# Patient Record
Sex: Female | Born: 1935 | Race: Black or African American | Hispanic: No | Smoking: Never smoker
Health system: Southern US, Community
[De-identification: ages and names within clinical notes are randomized; demographics above are authoritative.]

## PROBLEM LIST (undated history)

## (undated) DIAGNOSIS — I10 Essential (primary) hypertension: Secondary | ICD-10-CM

## (undated) DIAGNOSIS — M1990 Unspecified osteoarthritis, unspecified site: Secondary | ICD-10-CM

## (undated) DIAGNOSIS — H409 Unspecified glaucoma: Secondary | ICD-10-CM

## (undated) DIAGNOSIS — K219 Gastro-esophageal reflux disease without esophagitis: Secondary | ICD-10-CM

## (undated) DIAGNOSIS — E119 Type 2 diabetes mellitus without complications: Secondary | ICD-10-CM

## (undated) DIAGNOSIS — F0390 Unspecified dementia without behavioral disturbance: Secondary | ICD-10-CM

---

## 2004-11-07 ENCOUNTER — Ambulatory Visit (HOSPITAL_COMMUNITY): Admission: RE | Admit: 2004-11-07 | Discharge: 2004-11-07 | Payer: Self-pay | Admitting: Specialist

## 2011-03-22 ENCOUNTER — Observation Stay (HOSPITAL_COMMUNITY)
Admission: EM | Admit: 2011-03-22 | Discharge: 2011-03-26 | Disposition: A | Discharge status: Home / Self Care | Payer: Medicare Other | Attending: Internal Medicine | Admitting: Internal Medicine

## 2011-03-22 ENCOUNTER — Emergency Department (HOSPITAL_COMMUNITY): Payer: Medicare Other

## 2011-03-22 DIAGNOSIS — H409 Unspecified glaucoma: Secondary | ICD-10-CM | POA: Insufficient documentation

## 2011-03-22 DIAGNOSIS — J189 Pneumonia, unspecified organism: Secondary | ICD-10-CM | POA: Insufficient documentation

## 2011-03-22 DIAGNOSIS — R55 Syncope and collapse: Secondary | ICD-10-CM | POA: Insufficient documentation

## 2011-03-22 DIAGNOSIS — F05 Delirium due to known physiological condition: Secondary | ICD-10-CM | POA: Insufficient documentation

## 2011-03-22 DIAGNOSIS — Z79899 Other long term (current) drug therapy: Secondary | ICD-10-CM | POA: Insufficient documentation

## 2011-03-22 DIAGNOSIS — E1169 Type 2 diabetes mellitus with other specified complication: Principal | ICD-10-CM | POA: Insufficient documentation

## 2011-03-22 DIAGNOSIS — Z90710 Acquired absence of both cervix and uterus: Secondary | ICD-10-CM | POA: Insufficient documentation

## 2011-03-22 DIAGNOSIS — R9431 Abnormal electrocardiogram [ECG] [EKG]: Secondary | ICD-10-CM | POA: Insufficient documentation

## 2011-03-22 DIAGNOSIS — I10 Essential (primary) hypertension: Secondary | ICD-10-CM | POA: Insufficient documentation

## 2011-03-22 DIAGNOSIS — I517 Cardiomegaly: Secondary | ICD-10-CM | POA: Insufficient documentation

## 2011-03-22 LAB — CK TOTAL AND CKMB (NOT AT ARMC)
CK, MB: 1.9 ng/mL (ref 0.3–4.0)
Relative Index: INVALID (ref 0.0–2.5)
Total CK: 96 U/L (ref 7–177)

## 2011-03-22 LAB — URINALYSIS, ROUTINE W REFLEX MICROSCOPIC
Bilirubin Urine: NEGATIVE
Glucose, UA: 250 mg/dL — AB
Ketones, ur: NEGATIVE mg/dL
Nitrite: NEGATIVE
Protein, ur: NEGATIVE mg/dL
Specific Gravity, Urine: 1.012 (ref 1.005–1.030)
Urobilinogen, UA: 1 mg/dL (ref 0.0–1.0)
pH: 5.5 (ref 5.0–8.0)

## 2011-03-22 LAB — TROPONIN I: Troponin I: <0.3 ng/mL (ref <0.30)

## 2011-03-22 LAB — URINE MICROSCOPIC-ADD ON

## 2011-03-22 LAB — CBC
HCT: 37.3 % (ref 36.0–46.0)
Hemoglobin: 12.5 g/dL (ref 12.0–15.0)
MCH: 30.1 pg (ref 26.0–34.0)
MCHC: 33.5 g/dL (ref 30.0–36.0)
MCV: 89.9 fL (ref 78.0–100.0)
Platelets: 195 10*3/uL (ref 150–400)
RBC: 4.15 MIL/uL (ref 3.87–5.11)
RDW: 13.7 % (ref 11.5–15.5)
WBC: 8 10*3/uL (ref 4.0–10.5)

## 2011-03-22 LAB — COMPREHENSIVE METABOLIC PANEL
ALT: 14 U/L (ref 0–35)
AST: 17 U/L (ref 0–37)
Albumin: 3.5 g/dL (ref 3.5–5.2)
Alkaline Phosphatase: 64 U/L (ref 39–117)
BUN: 18 mg/dL (ref 6–23)
CO2: 28 mEq/L (ref 19–32)
Calcium: 8.6 mg/dL (ref 8.4–10.5)
Chloride: 96 mEq/L (ref 96–112)
Creatinine, Ser: 0.72 mg/dL (ref 0.4–1.2)
GFR calc Af Amer: >60 mL/min (ref >60)
GFR calc non Af Amer: >60 mL/min (ref >60)
Glucose, Bld: 110 mg/dL — ABNORMAL HIGH (ref 70–99)
Potassium: 4.2 mEq/L (ref 3.5–5.1)
Sodium: 132 mEq/L — ABNORMAL LOW (ref 135–145)
Total Bilirubin: 0.7 mg/dL (ref 0.3–1.2)
Total Protein: 7.2 g/dL (ref 6.0–8.3)

## 2011-03-22 LAB — DIFFERENTIAL
Basophils Absolute: 0 10*3/uL (ref 0.0–0.1)
Basophils Relative: 0 % (ref 0–1)
Eosinophils Absolute: 0.1 10*3/uL (ref 0.0–0.7)
Eosinophils Relative: 1 % (ref 0–5)
Lymphocytes Relative: 16 % (ref 12–46)
Lymphs Abs: 1.2 10*3/uL (ref 0.7–4.0)
Monocytes Absolute: 0.8 10*3/uL (ref 0.1–1.0)
Monocytes Relative: 10 % (ref 3–12)
Neutro Abs: 5.8 10*3/uL (ref 1.7–7.7)
Neutrophils Relative %: 73 % (ref 43–77)

## 2011-03-22 LAB — GLUCOSE, CAPILLARY: Glucose-Capillary: 228 mg/dL — ABNORMAL HIGH (ref 70–99)

## 2011-03-22 LAB — LIPASE, BLOOD: Lipase: 25 U/L (ref 11–59)

## 2011-03-23 LAB — GLUCOSE, CAPILLARY
Glucose-Capillary: 208 mg/dL — ABNORMAL HIGH (ref 70–99)
Glucose-Capillary: 177 mg/dL — ABNORMAL HIGH (ref 70–99)
Glucose-Capillary: 70 mg/dL (ref 70–99)
Glucose-Capillary: 141 mg/dL — ABNORMAL HIGH (ref 70–99)
Glucose-Capillary: 97 mg/dL (ref 70–99)
Glucose-Capillary: 81 mg/dL (ref 70–99)
Glucose-Capillary: 112 mg/dL — ABNORMAL HIGH (ref 70–99)
Glucose-Capillary: 167 mg/dL — ABNORMAL HIGH (ref 70–99)

## 2011-03-23 LAB — CBC
HCT: 34 % — ABNORMAL LOW (ref 36.0–46.0)
Hemoglobin: 11.4 g/dL — ABNORMAL LOW (ref 12.0–15.0)
MCH: 30 pg (ref 26.0–34.0)
MCHC: 33.5 g/dL (ref 30.0–36.0)
MCV: 89.5 fL (ref 78.0–100.0)
Platelets: 194 10*3/uL (ref 150–400)
RBC: 3.8 MIL/uL — ABNORMAL LOW (ref 3.87–5.11)
RDW: 13.6 % (ref 11.5–15.5)
WBC: 7.3 10*3/uL (ref 4.0–10.5)

## 2011-03-23 LAB — BASIC METABOLIC PANEL
BUN: 9 mg/dL (ref 6–23)
CO2: 27 mEq/L (ref 19–32)
Calcium: 8 mg/dL — ABNORMAL LOW (ref 8.4–10.5)
Chloride: 105 mEq/L (ref 96–112)
Creatinine, Ser: 0.53 mg/dL (ref 0.4–1.2)
GFR calc Af Amer: >60 mL/min (ref >60)
GFR calc non Af Amer: >60 mL/min (ref >60)
Glucose, Bld: 78 mg/dL (ref 70–99)
Potassium: 3.8 mEq/L (ref 3.5–5.1)
Sodium: 137 mEq/L (ref 135–145)

## 2011-03-23 LAB — DIFFERENTIAL
Basophils Absolute: 0 10*3/uL (ref 0.0–0.1)
Basophils Relative: 0 % (ref 0–1)
Eosinophils Absolute: 0.1 10*3/uL (ref 0.0–0.7)
Eosinophils Relative: 1 % (ref 0–5)
Lymphocytes Relative: 21 % (ref 12–46)
Lymphs Abs: 1.5 10*3/uL (ref 0.7–4.0)
Monocytes Absolute: 0.8 10*3/uL (ref 0.1–1.0)
Monocytes Relative: 12 % (ref 3–12)
Neutro Abs: 4.8 10*3/uL (ref 1.7–7.7)
Neutrophils Relative %: 66 % (ref 43–77)

## 2011-03-23 LAB — HEMOGLOBIN A1C
Hgb A1c MFr Bld: 6.1 % — ABNORMAL HIGH (ref <5.7)
Mean Plasma Glucose: 128 mg/dL — ABNORMAL HIGH (ref <117)

## 2011-03-23 LAB — TSH: TSH: 0.625 u[IU]/mL (ref 0.350–4.500)

## 2011-03-24 LAB — GLUCOSE, CAPILLARY
Glucose-Capillary: 127 mg/dL — ABNORMAL HIGH (ref 70–99)
Glucose-Capillary: 159 mg/dL — ABNORMAL HIGH (ref 70–99)
Glucose-Capillary: 165 mg/dL — ABNORMAL HIGH (ref 70–99)
Glucose-Capillary: 185 mg/dL — ABNORMAL HIGH (ref 70–99)

## 2011-03-25 LAB — GLUCOSE, CAPILLARY
Glucose-Capillary: 98 mg/dL (ref 70–99)
Glucose-Capillary: 75 mg/dL (ref 70–99)
Glucose-Capillary: 140 mg/dL — ABNORMAL HIGH (ref 70–99)
Glucose-Capillary: 183 mg/dL — ABNORMAL HIGH (ref 70–99)

## 2011-03-25 LAB — BASIC METABOLIC PANEL
BUN: 12 mg/dL (ref 6–23)
CO2: 23 mEq/L (ref 19–32)
Calcium: 8.8 mg/dL (ref 8.4–10.5)
Chloride: 104 mEq/L (ref 96–112)
Creatinine, Ser: 0.54 mg/dL (ref 0.4–1.2)
GFR calc Af Amer: >60 mL/min (ref >60)
GFR calc non Af Amer: >60 mL/min (ref >60)
Glucose, Bld: 105 mg/dL — ABNORMAL HIGH (ref 70–99)
Potassium: 4 mEq/L (ref 3.5–5.1)
Sodium: 136 mEq/L (ref 135–145)

## 2011-03-26 LAB — GLUCOSE, CAPILLARY: Glucose-Capillary: 123 mg/dL — ABNORMAL HIGH (ref 70–99)

## 2011-03-29 LAB — CULTURE, BLOOD (ROUTINE X 2)
Culture  Setup Time: 201206020144
Culture  Setup Time: 201206020144
Culture: NO GROWTH
Culture: NO GROWTH

## 2011-03-30 NOTE — H&P (Signed)
NAMECHELSE, MATAS NO.:  [id number]  MEDICAL RECORD NO.:  [id number]           PATIENT TYPE:  E  LOCATION:  WLED                         FACILITY:  [facility]  PHYSICIAN:  Della Goo, M.D. DATE OF BIRTH:  [birth year]  DATE OF ADMISSION:  03/22/2011 DATE OF DISCHARGE:                             HISTORY & PHYSICAL   DATE OF ADMISSION:  March 22, 2011.  PRIMARY CARE PHYSICIAN:  Georgianne Fick, M.D.  CHIEF COMPLAINTS:  Confusion, low blood sugar.  HISTORY OF PRESENT ILLNESS:  This is an 77 year old female who was brought to the emergency department after suffering an episode of confusion described as disorientation by her daughter.  Her daughter called EMS and EMS checked her blood sugar and she was found to have a blood sugar level of 44.  The patient has type 2 diabetes mellitus and takes glyburide therapy and she has had a decrease appetite over the past 2 days.  The patient reports almost passing out during this episode.  She denies having any nausea, vomiting or any chest pain or shortness of breath.  She does report for the past few days having cough which has been productive of greenish sputum.  The patient was brought to the emergency department, evaluated and a chest x-ray was performed which did reveal a left lower lobe pneumonia. The patient was started on Avelox therapy orally and administered dextrose for the low blood sugars and referred for medical admission.  PAST MEDICAL HISTORY:  Significant for type 2 diabetes mellitus, hypertension, glaucoma with visual impairment.  PAST SURGICAL HISTORY:  History of a hysterectomy and a bladder tacking surgery and right eye surgery for glaucoma.  MEDICATIONS:  Medications at this time include glyburide, sertraline, metoprolol, Mucinex over-the-counter and the glaucoma ophthalmic drops.  SOCIAL HISTORY:  The patient lives alone but temporarily has been staying with her daughter.  She is a  nonsmoker, nondrinker.  No history of illicit drug usage.  FAMILY HISTORY:  Noncontributory to this admission.  REVIEW OF SYSTEMS:  Pertinents as mentioned above in the HPI.  All other review of systems are negative.  PHYSICAL EXAMINATION FINDINGS:  GENERAL:  This is an 77 year old thin, elderly African-American female who is in no acute distress currently. VITAL SIGNS:  Temperature 99.7, blood pressure 135/76, heart rate 53 to 72, respirations 16 to 18, O2 sats 98% to 100%. HEENT EXAMINATION:  Normocephalic, atraumatic.  Pupils equally round and reactive to light.  Unable to visualize funduscopically.  There is no scleral icterus.  Nares patent bilaterally.  Oropharynx clear.  Oral mucosa is dry. NECK:  Supple.  Full range of motion.  No thyromegaly, adenopathy, jugular venous distention. CARDIOVASCULAR:  Regular rate and rhythm.  No murmurs, gallops or rubs appreciated. LUNGS:  Clear to auscultation.  No rales, rhonchi or wheezes appreciated.  Chest wall is nontender and breathing is unlabored and there is symmetric excursion of the chest wall. ABDOMEN:  Positive bowel sounds, soft, nontender, nondistended.  No hepatosplenomegaly. EXTREMITIES:  Without cyanosis, clubbing or edema. NEUROLOGIC EXAMINATION:  There are no focal deficits other than the visual impairment.  LABORATORY STUDIES:  White blood cell count 8.0, hemoglobin 12.5, hematocrit 37.3, platelets 195, neutrophils 73% lymphocytes 16%.  Sodium 132, potassium 4.2, chloride 96, CO2 of 28, BUN 18, creatinine 0.72 and glucose 110.  Cardiac enzymes with a total CK of 96, CK-MB 1.6, troponin less than 0.30.  Lipase level 25.  Urinalysis with glucose of 250, moderate urine hemoglobin and trace leukocyte esterase.  Chest x-ray reveals a streaky opacity in the left lower lung base.  ASSESSMENT:  An 77 year old female being admitted with: 1. Hypoglycemic episode secondary to poor intake and being on     hypoglycemic  medications. 2. Left lower lobe pneumonia which is community-acquired. 3. Type 2 diabetes mellitus. 4. Glaucoma. 5. Hypertension.  PLAN:  The patient will be admitted for 23-hour observation and serial glucose levels will be checked q.1 h x6, then q.4 h.  The hypoglycemic protocol will be followed and blood cultures x2 have been ordered along with nebulizer treatments, supplemental oxygen as needed.  The patient will continue on the Mucinex therapy and the antibiotic therapy.  For now, we will continue with Avelox p.o.  The patient's regular medications have been reconciled.  Her glyburide therapy will be held for now and the patient will be placed on DVT prophylaxis and she is a full code at this time.  Further workup will ensue pending results of the patient's clinical course and her studies.     Della Goo, M.D.     HJ/MEDQ  D:  03/22/2011  T:  03/22/2011  Job:  [id number]  cc:   Georgianne Fick, M.D. Fax: [phone]  Electronically Signed by Della Goo M.D. on 03/30/2011 06:32:36 AM

## 2011-04-25 NOTE — Discharge Summary (Signed)
[name], [name] NO.:  [id number]  MEDICAL RECORD NO.:  [id number]  LOCATION:  1332                         FACILITY:  [facility]  PHYSICIAN:  Clydia Llano, MD       DATE OF BIRTH:  [birth year]  DATE OF ADMISSION:  03/22/2011 DATE OF DISCHARGE:                              DISCHARGE SUMMARY   PRIMARY CARE PHYSICIAN:  Dr. Georgianne Fick  REASON FOR ADMISSION:  Confusion.  DISCHARGE DIAGNOSES: 1. Pneumonia. 2. Hypoglycemia and type 2 diabetes. 3. Diabetes mellitus type 2. 4. Glaucoma. 5. Hypertension.  DISCHARGE MEDICATIONS: 1. Glipizide 5 mg p.o. daily with meals. 2. Avelox 400 mg p.o. daily for 4 more days. 3. Combigan ophthalmic solution 1 drop in both eyes b.i.d. 4. Metoprolol tartrate 50 mg p.o. b.i.d. 5. Robitussin DM take 5 mL twice day as needed for cough. 6. Sertraline 50 mg p.o. daily. 7. Travatan ophthalmic 0.004% 1 drop daily at bedtime.  RADIOLOGY:  Chest x-ray on March 22, 2011, showed streak opacity in the left lung base, may represent his atelectasis or pneumonia.  There is cardiomegaly.  BRIEF HISTORY EXAMINATION:  [name] is an 77 year old African- American female with past medical history of diabetes and hypertension. The patient brought into the hospital because of confusion described as disorientation by her daughter.  EMS called and checked on her blood sugars.  She was found to have capillary blood sugar of 44.  The patient has type 2 diabetes and taking glyburide 10 mg and had decreased appetite over the past few days.  The patient reported almost passing out during these episodes.  Denies having nausea, vomiting or chest pain or shortness of breath.  Upon initial evaluation in the emergency department, the patient evaluated with the chest x-ray, she did develop left lower lobe pneumonia.  The patient was started on Avelox orally and administered dextrose for low blood sugar and referred for medical admission.  BRIEF  HOSPITAL STAY: 1. Left lower lobe pneumonia.  As mentioned above, chest x-ray showed     left lower lobe pneumonia.  The patient was started on oral Avelox.     Mucolytics and bronchodilators as well as oxygen also were     provided.  The patient was doing well and she did not need oxygen     for long time.  At time of discharge, the patient was felt well to     be discharged. 2. Hypoglycemia and type 2 diabetes.  As mentioned above, the patient     came in with hypoglycemia with capillary blood glucose of 44.  The     patient is taking glyburide that was stopped.  The patient was     placed on IV dextrose for less than 12 hours and then the patient's     blood sugar glucose went up and that was discontinued.  The     hypoglycemia is likely secondary to sulfonylurea along with the     poor oral intake.  That was switched to glipizide 5 mg daily at     time of discharge. 3. Diabetes mellitus type 2.  Seems pretty controlled on sulfonylureas  with hemoglobin A1c 6.1.  The dose of glyburide was switched to     glipizide 5 mg and the patient needs to followup for further     adjustment with her primary care physician. 4. Hypertension, controlled, during the hospital stay without any     changes in her medications.  The patient is on beta-blocker.  I     will suggest consideration to start ACE inhibitor or ARB to control     the blood pressure as the patient has diabetes but this is per her     primary care physician to follow up on.  DISCHARGE INSTRUCTIONS: 1. Diet:  Carbohydrate-modified diet. 2. Disposition:  Home 3. Activity:  As tolerated.     Clydia Llano, MD     ME/MEDQ  D:  03/26/2011  T:  03/26/2011  Job:  [id number]  cc:   Georgianne Fick, M.D. Fax: [phone]  Electronically Signed by Clydia Llano  on 04/25/2011 01:44:02 PM

## 2014-10-05 ENCOUNTER — Other Ambulatory Visit: Payer: Self-pay | Admitting: Internal Medicine

## 2014-10-05 DIAGNOSIS — R1310 Dysphagia, unspecified: Secondary | ICD-10-CM

## 2014-11-08 DIAGNOSIS — H4011X3 Primary open-angle glaucoma, severe stage: Secondary | ICD-10-CM | POA: Diagnosis not present

## 2014-12-19 ENCOUNTER — Emergency Department (HOSPITAL_COMMUNITY): Payer: Medicare Other

## 2014-12-19 ENCOUNTER — Emergency Department (HOSPITAL_COMMUNITY)
Admission: EM | Admit: 2014-12-19 | Discharge: 2014-12-19 | Disposition: A | Discharge status: Home / Self Care | Payer: Medicare Other | Attending: Emergency Medicine | Admitting: Emergency Medicine

## 2014-12-19 ENCOUNTER — Encounter (HOSPITAL_COMMUNITY): Payer: Self-pay | Admitting: Emergency Medicine

## 2014-12-19 DIAGNOSIS — Z79899 Other long term (current) drug therapy: Secondary | ICD-10-CM | POA: Insufficient documentation

## 2014-12-19 DIAGNOSIS — R0789 Other chest pain: Secondary | ICD-10-CM | POA: Diagnosis not present

## 2014-12-19 DIAGNOSIS — H409 Unspecified glaucoma: Secondary | ICD-10-CM | POA: Insufficient documentation

## 2014-12-19 DIAGNOSIS — F0390 Unspecified dementia without behavioral disturbance: Secondary | ICD-10-CM | POA: Diagnosis not present

## 2014-12-19 DIAGNOSIS — I10 Essential (primary) hypertension: Secondary | ICD-10-CM | POA: Diagnosis not present

## 2014-12-19 DIAGNOSIS — R079 Chest pain, unspecified: Secondary | ICD-10-CM | POA: Insufficient documentation

## 2014-12-19 DIAGNOSIS — M546 Pain in thoracic spine: Secondary | ICD-10-CM

## 2014-12-19 DIAGNOSIS — Z791 Long term (current) use of non-steroidal anti-inflammatories (NSAID): Secondary | ICD-10-CM | POA: Insufficient documentation

## 2014-12-19 DIAGNOSIS — E119 Type 2 diabetes mellitus without complications: Secondary | ICD-10-CM | POA: Diagnosis not present

## 2014-12-19 HISTORY — DX: Unspecified glaucoma: H40.9

## 2014-12-19 HISTORY — DX: Type 2 diabetes mellitus without complications: E11.9

## 2014-12-19 HISTORY — DX: Unspecified dementia, unspecified severity, without behavioral disturbance, psychotic disturbance, mood disturbance, and anxiety: F03.90

## 2014-12-19 HISTORY — DX: Essential (primary) hypertension: I10

## 2014-12-19 LAB — BASIC METABOLIC PANEL
Anion gap: 7 (ref 5–15)
BUN: 19 mg/dL (ref 6–23)
CO2: 29 mmol/L (ref 19–32)
Calcium: 9.2 mg/dL (ref 8.4–10.5)
Chloride: 100 mmol/L (ref 96–112)
Creatinine, Ser: 0.67 mg/dL (ref 0.50–1.10)
GFR calc Af Amer: 88 mL/min — ABNORMAL LOW (ref >90)
GFR calc non Af Amer: 76 mL/min — ABNORMAL LOW (ref >90)
Glucose, Bld: 191 mg/dL — ABNORMAL HIGH (ref 70–99)
Potassium: 3.7 mmol/L (ref 3.5–5.1)
Sodium: 136 mmol/L (ref 135–145)

## 2014-12-19 LAB — CBC
HCT: 38.8 % (ref 36.0–46.0)
Hemoglobin: 12.6 g/dL (ref 12.0–15.0)
MCH: 30.4 pg (ref 26.0–34.0)
MCHC: 32.5 g/dL (ref 30.0–36.0)
MCV: 93.5 fL (ref 78.0–100.0)
Platelets: 210 10*3/uL (ref 150–400)
RBC: 4.15 MIL/uL (ref 3.87–5.11)
RDW: 14.1 % (ref 11.5–15.5)
WBC: 7.4 10*3/uL (ref 4.0–10.5)

## 2014-12-19 LAB — I-STAT TROPONIN, ED: Troponin i, poc: 0.01 ng/mL (ref 0.00–0.08)

## 2014-12-19 MED ORDER — KETOROLAC TROMETHAMINE 60 MG/2ML IM SOLN
60.0000 mg | Freq: Once | INTRAMUSCULAR | Status: AC
  Administered 2014-12-19: 60 mg via INTRAMUSCULAR
  Filled 2014-12-19: qty 2

## 2014-12-19 MED ORDER — HYDROCODONE-ACETAMINOPHEN 5-325 MG PO TABS: 1.0000 | ORAL_TABLET | ORAL | Status: DC | PRN

## 2014-12-19 MED ORDER — HYDROCODONE-ACETAMINOPHEN 5-325 MG PO TABS
1.0000 | ORAL_TABLET | Freq: Once | ORAL | Status: AC
  Administered 2014-12-19: 1 via ORAL
  Filled 2014-12-19: qty 1

## 2014-12-19 NOTE — ED Notes (Signed)
Pt wants to eat something before administration of pain medication. Food provided. Will give meds shortly

## 2014-12-19 NOTE — ED Provider Notes (Signed)
CSN: [id number]     Arrival date & time 12/19/14  1234 History   First MD Initiated Contact with Patient 12/19/14 1456       Level V caveat: Dementia  The history is provided by the patient and a relative.   Patient presents to the emergency department complaining of left upper back pain over the past several days.  Patient does have a history of dementia and therefore she is somewhat limited in providing clear dates.  Family does admit that she has been complaining of this discomfort and pain over the past several days.  No reports of fever.  No reports of rash.  Patient does not seem short of breath to the family.  Appetite has been normal.  No vomiting or diarrhea noted by family or the patient.  Patient tried meloxicam with some improvement in his symptoms today.   Past Medical History  Diagnosis Date  . Diabetes mellitus without complication   . Hypertension   . Glaucoma   . Dementia    No past surgical history on file. No family history on file. History  Substance Use Topics  . Smoking status: Not on file  . Smokeless tobacco: Not on file  . Alcohol Use: Not on file   OB History    No data available     Review of Systems  Unable to perform ROS: Dementia      Allergies  Review of patient's allergies indicates no known allergies.  Home Medications   Prior to Admission medications   Medication Sig Start Date End Date Taking? Authorizing Provider  amLODipine (NORVASC) 5 MG tablet Take 5 mg by mouth daily.   Yes Historical Provider, MD  Brinzolamide-Brimonidine 1-0.2 % SUSP Apply 1 drop to eye 3 (three) times daily.   Yes Historical Provider, MD  divalproex (DEPAKOTE) 250 MG DR tablet Take 250 mg by mouth 2 (two) times daily.   Yes Historical Provider, MD  donepezil (ARICEPT) 10 MG tablet Take 10 mg by mouth at bedtime.   Yes Historical Provider, MD  glipiZIDE (GLUCOTROL) 5 MG tablet Take 5 mg by mouth daily before breakfast.   Yes Historical Provider, MD  guaiFENesin  (ROBITUSSIN) 100 MG/5ML liquid Take 200 mg by mouth daily as needed for cough.   Yes Historical Provider, MD  meloxicam (MOBIC) 15 MG tablet Take 15 mg by mouth daily.   Yes Historical Provider, MD  memantine (NAMENDA XR) 28 MG CP24 24 hr capsule Take 28 mg by mouth daily.   Yes Historical Provider, MD  sertraline (ZOLOFT) 100 MG tablet Take 100 mg by mouth at bedtime.   Yes Historical Provider, MD  travoprost, benzalkonium, (TRAVATAN) 0.004 % ophthalmic solution Place 1 drop into both eyes at bedtime.   Yes Historical Provider, MD   BP 203/69 mmHg  Pulse 58  Temp(Src) 98.5 F (36.9 C) (Oral)  Resp 18  SpO2 98% Physical Exam  Constitutional: She appears well-developed and well-nourished. No distress.  HENT:  Head: Normocephalic and atraumatic.  Eyes: EOM are normal.  Neck: Normal range of motion.  Cardiovascular: Normal rate, regular rhythm and normal heart sounds.   Pulmonary/Chest: Effort normal and breath sounds normal.  Abdominal: Soft. She exhibits no distension. There is no tenderness.  Musculoskeletal: Normal range of motion.  Normal left radial pulse.  Full range of motion of left elbow and left shoulder.  Some exacerbation of the pain in the left parathoracic region with range of motion of the left shoulder.  No scapular  tenderness on the left.  No rash noted in this area.  No cervical or thoracic point tenderness.  Neurological: She is alert.  Skin: Skin is warm and dry. No rash noted.  Psychiatric: She has a normal mood and affect. Judgment normal.  Nursing note and vitals reviewed.   ED Course  Procedures (including critical care time) Labs Review Labs Reviewed  BASIC METABOLIC PANEL - Abnormal; Notable for the following:    Glucose, Bld 191 (*)    GFR calc non Af Amer 76 (*)    GFR calc Af Amer 88 (*)    All other components within normal limits  CBC  I-STAT TROPOININ, ED    Imaging Review Dg Chest 2 View  12/19/2014   CLINICAL DATA:  Acute chest pain.  EXAM:  CHEST  2 VIEW  COMPARISON:  March 22, 2011.  FINDINGS: The heart size and mediastinal contours are within normal limits. Both lungs are clear. No pneumothorax or pleural effusion is noted. Multilevel degenerative disc disease is noted in the thoracic spine.  IMPRESSION: No acute cardiopulmonary abnormality seen.   Electronically Signed   By: Marijo Conception, M.D.   On: 12/19/2014 13:54  I personally reviewed the imaging tests through PACS system I reviewed available ER/hospitalization records through the EMR    EKG Interpretation   Date/Time:  Monday December 19 2014 12:47:16 EST Ventricular Rate:  66 PR Interval:  147 QRS Duration: 92 QT Interval:  392 QTC Calculation: 411 R Axis:   -34 Text Interpretation:  Sinus rhythm Atrial premature complex Abnormal  R-wave progression, early transition Left ventricular hypertrophy  Borderline T abnormalities, anterior leads No significant change was found  Confirmed by Dnasia Gauna  MD, Greysen Swanton ([id number]) on 12/19/2014 4:04:09 PM      MDM   Final diagnoses:  Left-sided thoracic back pain  Chest pain, unspecified chest pain type    Pain improved after hydrocodone.  EKG without acute ischemic changes.  Labs and x-ray without acute abnormalities.  Suspect musculoskeletal pain.  Discharge home in good condition    Hoy Morn, MD 12/19/14 1714

## 2014-12-19 NOTE — ED Notes (Signed)
Pt c/o pain in chest radiating to back onset today, family member states that yesterday pt had no appetite.

## 2015-09-18 DIAGNOSIS — H401133 Primary open-angle glaucoma, bilateral, severe stage: Secondary | ICD-10-CM | POA: Diagnosis not present

## 2016-01-18 DIAGNOSIS — E119 Type 2 diabetes mellitus without complications: Secondary | ICD-10-CM | POA: Diagnosis not present

## 2016-01-18 DIAGNOSIS — E049 Nontoxic goiter, unspecified: Secondary | ICD-10-CM | POA: Diagnosis not present

## 2016-01-18 DIAGNOSIS — I10 Essential (primary) hypertension: Secondary | ICD-10-CM | POA: Diagnosis not present

## 2016-10-02 DIAGNOSIS — H401133 Primary open-angle glaucoma, bilateral, severe stage: Secondary | ICD-10-CM | POA: Diagnosis not present

## 2016-10-02 DIAGNOSIS — H04123 Dry eye syndrome of bilateral lacrimal glands: Secondary | ICD-10-CM | POA: Diagnosis not present

## 2016-10-16 DIAGNOSIS — H348312 Tributary (branch) retinal vein occlusion, right eye, stable: Secondary | ICD-10-CM | POA: Diagnosis not present

## 2016-10-16 DIAGNOSIS — H401133 Primary open-angle glaucoma, bilateral, severe stage: Secondary | ICD-10-CM | POA: Diagnosis not present

## 2016-11-01 DIAGNOSIS — E119 Type 2 diabetes mellitus without complications: Secondary | ICD-10-CM | POA: Diagnosis not present

## 2016-11-01 DIAGNOSIS — H47011 Ischemic optic neuropathy, right eye: Secondary | ICD-10-CM | POA: Diagnosis not present

## 2016-11-01 DIAGNOSIS — H401130 Primary open-angle glaucoma, bilateral, stage unspecified: Secondary | ICD-10-CM | POA: Diagnosis not present

## 2016-11-01 DIAGNOSIS — H348112 Central retinal vein occlusion, right eye, stable: Secondary | ICD-10-CM | POA: Diagnosis not present

## 2016-12-04 DIAGNOSIS — E1165 Type 2 diabetes mellitus with hyperglycemia: Secondary | ICD-10-CM | POA: Diagnosis not present

## 2016-12-04 DIAGNOSIS — L989 Disorder of the skin and subcutaneous tissue, unspecified: Secondary | ICD-10-CM | POA: Diagnosis not present

## 2016-12-04 DIAGNOSIS — L84 Corns and callosities: Secondary | ICD-10-CM | POA: Diagnosis not present

## 2016-12-12 DIAGNOSIS — L84 Corns and callosities: Secondary | ICD-10-CM | POA: Diagnosis not present

## 2016-12-12 DIAGNOSIS — L853 Xerosis cutis: Secondary | ICD-10-CM | POA: Diagnosis not present

## 2016-12-12 DIAGNOSIS — E1351 Other specified diabetes mellitus with diabetic peripheral angiopathy without gangrene: Secondary | ICD-10-CM | POA: Diagnosis not present

## 2016-12-12 DIAGNOSIS — I70293 Other atherosclerosis of native arteries of extremities, bilateral legs: Secondary | ICD-10-CM | POA: Diagnosis not present

## 2016-12-12 DIAGNOSIS — L602 Onychogryphosis: Secondary | ICD-10-CM | POA: Diagnosis not present

## 2016-12-24 DIAGNOSIS — L219 Seborrheic dermatitis, unspecified: Secondary | ICD-10-CM | POA: Diagnosis not present

## 2016-12-24 DIAGNOSIS — L821 Other seborrheic keratosis: Secondary | ICD-10-CM | POA: Diagnosis not present

## 2016-12-24 DIAGNOSIS — B079 Viral wart, unspecified: Secondary | ICD-10-CM | POA: Diagnosis not present

## 2017-05-19 DIAGNOSIS — H34831 Tributary (branch) retinal vein occlusion, right eye, with macular edema: Secondary | ICD-10-CM | POA: Diagnosis not present

## 2017-09-11 ENCOUNTER — Other Ambulatory Visit: Payer: Self-pay

## 2017-09-11 ENCOUNTER — Observation Stay (HOSPITAL_COMMUNITY)
Admission: EM | Admit: 2017-09-11 | Discharge: 2017-09-13 | Disposition: A | Discharge status: Home / Self Care | Payer: Medicare Other | Attending: Internal Medicine | Admitting: Internal Medicine

## 2017-09-11 ENCOUNTER — Encounter (HOSPITAL_COMMUNITY): Payer: Self-pay | Admitting: Emergency Medicine

## 2017-09-11 DIAGNOSIS — E871 Hypo-osmolality and hyponatremia: Secondary | ICD-10-CM | POA: Diagnosis present

## 2017-09-11 DIAGNOSIS — K219 Gastro-esophageal reflux disease without esophagitis: Secondary | ICD-10-CM

## 2017-09-11 DIAGNOSIS — R109 Unspecified abdominal pain: Secondary | ICD-10-CM

## 2017-09-11 DIAGNOSIS — R55 Syncope and collapse: Secondary | ICD-10-CM | POA: Diagnosis not present

## 2017-09-11 DIAGNOSIS — R410 Disorientation, unspecified: Secondary | ICD-10-CM | POA: Diagnosis not present

## 2017-09-11 DIAGNOSIS — Z79899 Other long term (current) drug therapy: Secondary | ICD-10-CM | POA: Insufficient documentation

## 2017-09-11 DIAGNOSIS — E119 Type 2 diabetes mellitus without complications: Secondary | ICD-10-CM | POA: Insufficient documentation

## 2017-09-11 DIAGNOSIS — F0390 Unspecified dementia without behavioral disturbance: Secondary | ICD-10-CM | POA: Diagnosis not present

## 2017-09-11 DIAGNOSIS — Z7984 Long term (current) use of oral hypoglycemic drugs: Secondary | ICD-10-CM | POA: Diagnosis not present

## 2017-09-11 DIAGNOSIS — D497 Neoplasm of unspecified behavior of endocrine glands and other parts of nervous system: Secondary | ICD-10-CM | POA: Diagnosis present

## 2017-09-11 DIAGNOSIS — G459 Transient cerebral ischemic attack, unspecified: Principal | ICD-10-CM | POA: Insufficient documentation

## 2017-09-11 DIAGNOSIS — I10 Essential (primary) hypertension: Secondary | ICD-10-CM | POA: Diagnosis not present

## 2017-09-11 DIAGNOSIS — E041 Nontoxic single thyroid nodule: Secondary | ICD-10-CM | POA: Diagnosis not present

## 2017-09-11 DIAGNOSIS — R404 Transient alteration of awareness: Secondary | ICD-10-CM | POA: Diagnosis not present

## 2017-09-11 DIAGNOSIS — R9431 Abnormal electrocardiogram [ECG] [EKG]: Secondary | ICD-10-CM | POA: Diagnosis not present

## 2017-09-11 DIAGNOSIS — R059 Cough, unspecified: Secondary | ICD-10-CM

## 2017-09-11 DIAGNOSIS — R05 Cough: Secondary | ICD-10-CM | POA: Insufficient documentation

## 2017-09-11 DIAGNOSIS — J309 Allergic rhinitis, unspecified: Secondary | ICD-10-CM

## 2017-09-11 LAB — COMPREHENSIVE METABOLIC PANEL
ALT: 19 U/L (ref 14–54)
AST: 25 U/L (ref 15–41)
Albumin: 3.9 g/dL (ref 3.5–5.0)
Alkaline Phosphatase: 76 U/L (ref 38–126)
Anion gap: 7 (ref 5–15)
BUN: 15 mg/dL (ref 6–20)
CO2: 24 mmol/L (ref 22–32)
Calcium: 9 mg/dL (ref 8.9–10.3)
Chloride: 102 mmol/L (ref 101–111)
Creatinine, Ser: 0.91 mg/dL (ref 0.44–1.00)
GFR calc Af Amer: >60 mL/min (ref >60)
GFR calc non Af Amer: 54 mL/min — ABNORMAL LOW (ref >60)
Glucose, Bld: 259 mg/dL — ABNORMAL HIGH (ref 65–99)
Potassium: 4.5 mmol/L (ref 3.5–5.1)
Sodium: 133 mmol/L — ABNORMAL LOW (ref 135–145)
Total Bilirubin: 1.5 mg/dL — ABNORMAL HIGH (ref 0.3–1.2)
Total Protein: 7.1 g/dL (ref 6.5–8.1)

## 2017-09-11 LAB — CBC WITH DIFFERENTIAL/PLATELET
Basophils Absolute: 0 10*3/uL (ref 0.0–0.1)
Basophils Relative: 0 %
Eosinophils Absolute: 0 10*3/uL (ref 0.0–0.7)
Eosinophils Relative: 0 %
HCT: 42 % (ref 36.0–46.0)
Hemoglobin: 14 g/dL (ref 12.0–15.0)
Lymphocytes Relative: 6 %
Lymphs Abs: 0.7 10*3/uL (ref 0.7–4.0)
MCH: 30.4 pg (ref 26.0–34.0)
MCHC: 33.3 g/dL (ref 30.0–36.0)
MCV: 91.1 fL (ref 78.0–100.0)
Monocytes Absolute: 0.5 10*3/uL (ref 0.1–1.0)
Monocytes Relative: 4 %
Neutro Abs: 10.7 10*3/uL — ABNORMAL HIGH (ref 1.7–7.7)
Neutrophils Relative %: 90 %
Platelets: 209 10*3/uL (ref 150–400)
RBC: 4.61 MIL/uL (ref 3.87–5.11)
RDW: 13.7 % (ref 11.5–15.5)
WBC: 11.8 10*3/uL — ABNORMAL HIGH (ref 4.0–10.5)

## 2017-09-11 LAB — CBG MONITORING, ED: Glucose-Capillary: 252 mg/dL — ABNORMAL HIGH (ref 65–99)

## 2017-09-11 NOTE — ED Provider Notes (Signed)
[facility]ER[facility]ENC[facility] DEPARTMENT Provider Note   CSN: [id number] Arrival date & time: 09/11/17  2229     History   Chief Complaint Chief Complaint  Patient presents with  . Hyperglycemia    HPI [name] is a 83 y.o. female.  The history is provided by a relative. The history is limited by the condition of the patient (Dementia).  Hyperglycemia  This evening at about 8 PM, she started complaining of severe abdominal pain.  Family noted that she was diaphoretic.  They noticed that she seemed to be staring straight ahead, and then had some garbled speech.  They called for an ambulance and patient was unable to ambulate.  She is normally able to ambulate without difficulty.  Within about 10 minutes, she returned to her baseline mental status.  She does have underlying dementia and has history of diabetes as well as hypertension.  She has had some intermittent episodes of abdominal pain, but is never had episodes of difficulty speaking and difficulty walking before.  She is still complaining of some mild abdominal pain.  There was no vomiting and no diarrhea.  Family also states that she has had a cough for about the last week.  She is getting some medication from her primary care provider, but they are not sure what it is.  Patient states that she thinks it is from allergies.  They are not aware of any fever or chills.  She did not have any sweats prior to tonight.  There has been no chest pain or dyspnea.  Past Medical History:  Diagnosis Date  . Dementia   . Diabetes mellitus without complication ([place])   . Glaucoma   . Hypertension     There are no active problems to display for this patient.   History reviewed. No pertinent surgical history.  OB History    No data available       Home Medications    Prior to Admission medications   Medication Sig Start Date End Date Taking? Authorizing Provider  amLODipine (NORVASC) 5 MG tablet Take 5 mg by  mouth daily.    [provider]  Brinzolamide-Brimonidine 1-0.2 % SUSP Apply 1 drop to eye 3 (three) times daily.    [provider]  divalproex (DEPAKOTE) 250 MG DR tablet Take 250 mg by mouth 2 (two) times daily.    [provider]  donepezil (ARICEPT) 10 MG tablet Take 10 mg by mouth at bedtime.    [provider]  glipiZIDE (GLUCOTROL) 5 MG tablet Take 5 mg by mouth daily before breakfast.    [provider]  guaiFENesin (ROBITUSSIN) 100 MG/5ML liquid Take 200 mg by mouth daily as needed for cough.    [provider]  HYDROcodone-acetaminophen (NORCO/VICODIN) 5-325 MG per tablet Take 1 tablet by mouth every 4 (four) hours as needed for moderate pain. 12/19/14   Jola Schmidt, MD  meloxicam (MOBIC) 15 MG tablet Take 15 mg by mouth daily.    [provider]  memantine (NAMENDA XR) 28 MG CP24 24 hr capsule Take 28 mg by mouth daily.    [provider]  sertraline (ZOLOFT) 100 MG tablet Take 100 mg by mouth at bedtime.    [provider]  travoprost, benzalkonium, (TRAVATAN) 0.004 % ophthalmic solution Place 1 drop into both eyes at bedtime.    [provider]    Family History History reviewed. No pertinent family history.  Social History Social History  Tobacco Use  . Smoking status: Never Smoker  . Smokeless tobacco: Never Used  Substance Use Topics  . Alcohol use: No    Frequency: Never  . Drug use: No     Allergies   Patient has no known allergies.   Review of Systems Review of Systems  Unable to perform ROS: Dementia     Physical Exam Updated Vital Signs BP 122/73   Pulse 76   Temp 98.4 F (36.9 C) (Oral)   Resp 16   SpO2 99%   Physical Exam  Nursing note and vitals reviewed.  83 year old female, resting comfortably and in no acute distress. Vital signs are normal. Oxygen saturation is 99%, which is normal. Head is normocephalic and atraumatic. PERRLA, EOMI. Oropharynx  is clear. Neck is nontender and supple without adenopathy or JVD.  There are no carotid bruits. Back is nontender and there is no CVA tenderness. Lungs are clear without rales, wheezes, or rhonchi. Chest is nontender. Heart has regular rate and rhythm with 1-3/2 systolic ejection murmur best heard at the upper left sternal border. Abdomen is soft, flat, with mild to moderate tenderness in the right upper quadrant and right lower quadrant.  There is also some mild suprapubic tenderness.  There are no masses or hepatosplenomegaly and peristalsis is normoactive. Extremities have no cyanosis or edema, full range of motion is present. Skin is warm and dry without rash. Neurologic: She is awake and alert and oriented to person but not place or time, cranial nerves are intact, there are no motor or sensory deficits.  Speech is spontaneous and appropriate without any dysarthria.  There is no pronator drift.  ED Treatments / Results  Labs (all labs ordered are listed, but only abnormal results are displayed) Labs Reviewed  URINALYSIS, ROUTINE W REFLEX MICROSCOPIC - Abnormal; Notable for the following components:      Result Value   Glucose, UA >=500 (*)    Hgb urine dipstick SMALL (*)    Leukocytes, UA TRACE (*)    Squamous Epithelial / LPF 0-5 (*)    All other components within normal limits  COMPREHENSIVE METABOLIC PANEL - Abnormal; Notable for the following components:   Sodium 133 (*)    Glucose, Bld 259 (*)    Total Bilirubin 1.5 (*)    GFR calc non Af Amer 54 (*)    All other components within normal limits  CBC WITH DIFFERENTIAL/PLATELET - Abnormal; Notable for the following components:   WBC 11.8 (*)    Neutro Abs 10.7 (*)    All other components within normal limits  CBG MONITORING, ED - Abnormal; Notable for the following components:   Glucose-Capillary 252 (*)    All other components within normal limits  LIPASE, BLOOD  HEMOGLOBIN A1C  LIPID PANEL    EKG  EKG  Interpretation  Date/Time:  Thursday September 11 2017 23:39:25 EST Ventricular Rate:  66 PR Interval:    QRS Duration: 97 QT Interval:  399 QTC Calculation: 418 R Axis:   -44 Text Interpretation:  Sinus rhythm Left anterior fascicular block Abnormal R-wave progression, late transition Abnormal T, consider ischemia, anterior leads Baseline wander in lead(s) III aVF When compared with ECG of 12/19/2014, No significant change was found Confirmed by Delora Fuel ([id number]) on 09/11/2017 11:43:30 PM       Radiology Dg Chest 2 View  Result Date: 09/12/2017 CLINICAL DATA:  Cough for 1 day EXAM: CHEST  2 VIEW COMPARISON:  12/19/2014 FINDINGS: Mild cardiac enlargement.  No pulmonary vascular congestion or edema. No blunting of costophrenic angles. No pneumothorax. Since the previous study, there is interval development of a 3.6 cm soft tissue structure in the right paratracheal region. This is worrisome for developing mass or lymphadenopathy. CT chest recommended for further evaluation. Calcified and tortuous aorta. No pneumothorax. IMPRESSION: Developing soft tissue process in the right paratracheal region may represent developing mass or lymphadenopathy. CT chest recommended for further evaluation. Cardiac enlargement without vascular congestion or edema. Electronically Signed   By: Lucienne Capers M.D.   On: 09/12/2017 00:41   Ct Head Wo Contrast  Result Date: 09/12/2017 CLINICAL DATA:  Mild confusion and memory loss. Sweating and high blood sugar. History of dementia. No focal neurological defect. EXAM: CT HEAD WITHOUT CONTRAST TECHNIQUE: Contiguous axial images were obtained from the base of the skull through the vertex without intravenous contrast. COMPARISON:  None. FINDINGS: Brain: Mild diffuse cerebral atrophy. Ventricular dilatation consistent with central atrophy. Patchy low-attenuation changes in the deep white matter consistent with small vessel ischemia. No mass-effect or midline shift. No  abnormal extra-axial fluid collections. Gray-white matter junctions are distinct. Basal cisterns are not effaced. No acute intracranial hemorrhage. Vascular: Intracranial arterial vascular calcifications are present. Skull: Calvarium appears intact. Sinuses/Orbits: Mucosal thickening in the paranasal sinuses. No acute air-fluid levels. Mastoid air cells are clear. Other: None. IMPRESSION: No acute intracranial abnormalities. Chronic atrophy and small vessel ischemic changes. Electronically Signed   By: Lucienne Capers M.D.   On: 09/12/2017 01:19   Ct Abdomen Pelvis W Contrast  Result Date: 09/12/2017 CLINICAL DATA:  Confusion, diaphoresis and hyperglycemia. Left upper abdominal pain. EXAM: CT ABDOMEN AND PELVIS WITH CONTRAST TECHNIQUE: Multidetector CT imaging of the abdomen and pelvis was performed using the standard protocol following bolus administration of intravenous contrast. CONTRAST:  110mL ISOVUE-300 IOPAMIDOL (ISOVUE-300) INJECTION 61% COMPARISON:  None. FINDINGS: Lower chest: No pulmonary nodules or pleural effusion. No visible pericardial effusion. Hepatobiliary: Small right hepatic lobe cyst. No focal hepatic abnormality. No biliary dilatation. Normal gallbladder. Pancreas: Normal contours without ductal dilatation. No peripancreatic fluid collection. Spleen: Normal. Adrenals/Urinary Tract: --Adrenal glands: Normal. --Right kidney/ureter: Subcentimeter renal hypodensities are likely cysts. No solid mass. No nephrolithiasis. No hydronephrosis. --Left kidney/ureter: No hydronephrosis or perinephric stranding. No nephrolithiasis. No obstructing ureteral stones. --Urinary bladder: Unremarkable. Stomach/Bowel: --Stomach/Duodenum: Patulous distal esophagus. No hiatal hernia. Normal duodenal course. --Small bowel: No dilatation or inflammation. --Colon: No focal abnormality. --Appendix: Normal. Vascular/Lymphatic: Atherosclerotic calcification is present within the non-aneurysmal abdominal aorta, without  hemodynamically significant stenosis. No abdominal or pelvic lymphadenopathy. Reproductive: Status post hysterectomy. No adnexal mass. Musculoskeletal. No bony spinal canal stenosis or focal osseous abnormality. Other: None. IMPRESSION: 1. No acute abdominopelvic abnormality. 2.  Aortic Atherosclerosis (ICD10-I70.0). Electronically Signed   By: Ulyses Jarred M.D.   On: 09/12/2017 01:30    Procedures Procedures (including critical care time)  Medications Ordered in ED Medications  divalproex (DEPAKOTE) DR tablet 250 mg (not administered)  sertraline (ZOLOFT) tablet 100 mg (not administered)  brimonidine (ALPHAGAN) 0.2 % ophthalmic solution 1 drop (not administered)  divalproex (DEPAKOTE) DR tablet 250 mg (not administered)  donepezil (ARICEPT) tablet 10 mg (not administered)  guaiFENesin (ROBITUSSIN) 100 MG/5ML liquid 200 mg (not administered)  HYDROcodone-acetaminophen (NORCO/VICODIN) 5-325 MG per tablet 1 tablet (not administered)  meloxicam (MOBIC) tablet 15 mg (not administered)  memantine (NAMENDA XR) 24 hr capsule 28 mg (not administered)  sertraline (ZOLOFT) tablet 100 mg (not administered)  travoprost (benzalkonium) (TRAVATAN) 0.004 % ophthalmic solution 1 drop (not  administered)   stroke: mapping our early stages of recovery book (not administered)  0.9 %  sodium chloride infusion (not administered)  acetaminophen (TYLENOL) tablet 650 mg (not administered)    Or  acetaminophen (TYLENOL) solution 650 mg (not administered)    Or  acetaminophen (TYLENOL) suppository 650 mg (not administered)  senna-docusate (Senokot-S) tablet 1 tablet (not administered)  enoxaparin (LOVENOX) injection 40 mg (not administered)  aspirin suppository 300 mg (not administered)    Or  aspirin tablet 325 mg (not administered)  insulin aspart (novoLOG) injection 0-15 Units (not administered)  insulin aspart (novoLOG) injection 0-5 Units (not administered)  insulin aspart (novoLOG) injection 4 Units (not  administered)  iopamidol (ISOVUE-300) 61 % injection (100 mLs  Contrast Given 09/12/17 0047)     Initial Impression / Assessment and Plan / ED Course  I have reviewed the triage vital signs and the nursing notes.  Pertinent labs & imaging results that were available during my care of the patient were reviewed by me and considered in my medical decision making (see chart for details).  Transient a aphasia and difficulty walking most consistent with transient ischemic attack.  Doubt seizure.  No neurologic deficit currently except for baseline dementia.  Family states that her mental status is at her baseline.  Abdominal pain of uncertain cause.  Possible biliary tract disease, possible diverticulitis, possible peptic ulcer disease.  Episode of diaphoresis is probably related to her pain and possibly vagal in origin.  The glucose is over 200, so doubt episode of hypoglycemia.  Old records are reviewed, and she has no relevant past visits.  Will start TIA evaluation with CT of head and screening labs.  Will also check chest x-ray because of persistent cough.  Because of abdominal pain and tenderness, will check CT of abdomen and pelvis.  Anticipate need for admission for TIA workup.  Laboratory workup is unremarkable.  CT of abdomen and pelvis is unremarkable.  Chest x-ray shows paratracheal mass, and CT of the chest has been ordered.  Head CT is unremarkable.  Patient is starting to get agitated, and family relates that that is not unusual for her at this time of night.  Will hold off on getting MRI patient is less agitated.  Case is discussed with Dr. Myna Hidalgo of [facility], who agrees to admit the patient for TIA workup.  Final Clinical Impressions(s) / ED Diagnoses   Final diagnoses:  TIA (transient ischemic attack)  Cough  Abdominal pain, unspecified abdominal location    ED Discharge Orders    None       Delora Fuel, MD 62/94/76 [phone]

## 2017-09-11 NOTE — ED Triage Notes (Signed)
Pt brought to ED by [facility] from home for c/o mild confusion with sweating and high blood sugar. Pt able to ambulate with assistance with slow steady gait on triage, pt has a hx short term dementia, no neuro deficit noticed.

## 2017-09-12 ENCOUNTER — Encounter (HOSPITAL_COMMUNITY): Payer: Self-pay | Admitting: Family Medicine

## 2017-09-12 ENCOUNTER — Observation Stay (HOSPITAL_BASED_OUTPATIENT_CLINIC_OR_DEPARTMENT_OTHER): Payer: Medicare Other

## 2017-09-12 ENCOUNTER — Emergency Department (HOSPITAL_COMMUNITY): Payer: Medicare Other

## 2017-09-12 ENCOUNTER — Observation Stay (HOSPITAL_COMMUNITY): Payer: Medicare Other

## 2017-09-12 DIAGNOSIS — E119 Type 2 diabetes mellitus without complications: Secondary | ICD-10-CM | POA: Diagnosis not present

## 2017-09-12 DIAGNOSIS — G459 Transient cerebral ischemic attack, unspecified: Secondary | ICD-10-CM | POA: Diagnosis not present

## 2017-09-12 DIAGNOSIS — E871 Hypo-osmolality and hyponatremia: Secondary | ICD-10-CM

## 2017-09-12 DIAGNOSIS — R410 Disorientation, unspecified: Secondary | ICD-10-CM | POA: Diagnosis not present

## 2017-09-12 DIAGNOSIS — R05 Cough: Secondary | ICD-10-CM | POA: Diagnosis not present

## 2017-09-12 DIAGNOSIS — R109 Unspecified abdominal pain: Secondary | ICD-10-CM | POA: Diagnosis not present

## 2017-09-12 DIAGNOSIS — I361 Nonrheumatic tricuspid (valve) insufficiency: Secondary | ICD-10-CM | POA: Diagnosis not present

## 2017-09-12 DIAGNOSIS — R4781 Slurred speech: Secondary | ICD-10-CM | POA: Diagnosis not present

## 2017-09-12 DIAGNOSIS — F0390 Unspecified dementia without behavioral disturbance: Secondary | ICD-10-CM | POA: Diagnosis not present

## 2017-09-12 DIAGNOSIS — D497 Neoplasm of unspecified behavior of endocrine glands and other parts of nervous system: Secondary | ICD-10-CM | POA: Diagnosis not present

## 2017-09-12 DIAGNOSIS — I10 Essential (primary) hypertension: Secondary | ICD-10-CM | POA: Diagnosis not present

## 2017-09-12 DIAGNOSIS — R911 Solitary pulmonary nodule: Secondary | ICD-10-CM | POA: Diagnosis not present

## 2017-09-12 LAB — LIPID PANEL
Cholesterol: 164 mg/dL (ref 0–200)
HDL: 75 mg/dL (ref >40)
LDL Cholesterol: 82 mg/dL (ref 0–99)
Total CHOL/HDL Ratio: 2.2 RATIO
Triglycerides: 33 mg/dL (ref <150)
VLDL: 7 mg/dL (ref 0–40)

## 2017-09-12 LAB — GLUCOSE, CAPILLARY
Glucose-Capillary: 264 mg/dL — ABNORMAL HIGH (ref 65–99)
Glucose-Capillary: 140 mg/dL — ABNORMAL HIGH (ref 65–99)
Glucose-Capillary: 110 mg/dL — ABNORMAL HIGH (ref 65–99)
Glucose-Capillary: 158 mg/dL — ABNORMAL HIGH (ref 65–99)
Glucose-Capillary: 117 mg/dL — ABNORMAL HIGH (ref 65–99)

## 2017-09-12 LAB — URINALYSIS, ROUTINE W REFLEX MICROSCOPIC
Bacteria, UA: NONE SEEN
Bilirubin Urine: NEGATIVE
Glucose, UA: >=500 mg/dL — AB
Ketones, ur: NEGATIVE mg/dL
Nitrite: NEGATIVE
Protein, ur: NEGATIVE mg/dL
Specific Gravity, Urine: 1.02 (ref 1.005–1.030)
pH: 5 (ref 5.0–8.0)

## 2017-09-12 LAB — ECHOCARDIOGRAM COMPLETE
Height: 64 in
Weight: 2589.08 oz

## 2017-09-12 LAB — HEMOGLOBIN A1C
Hgb A1c MFr Bld: 7 % — ABNORMAL HIGH (ref 4.8–5.6)
Mean Plasma Glucose: 154.2 mg/dL

## 2017-09-12 LAB — LIPASE, BLOOD: Lipase: 23 U/L (ref 11–51)

## 2017-09-12 MED ORDER — DIVALPROEX SODIUM 250 MG PO DR TAB
250.0000 mg | DELAYED_RELEASE_TABLET | Freq: Two times a day (BID) | ORAL | Status: DC
  Administered 2017-09-12 – 2017-09-13 (×2): 250 mg via ORAL
  Filled 2017-09-12 (×3): qty 1

## 2017-09-12 MED ORDER — ASPIRIN 325 MG PO TABS
325.0000 mg | ORAL_TABLET | Freq: Every day | ORAL | Status: DC
  Administered 2017-09-13: 325 mg via ORAL
  Filled 2017-09-12 (×2): qty 1

## 2017-09-12 MED ORDER — STROKE: EARLY STAGES OF RECOVERY BOOK
Freq: Once | Status: AC
  Administered 2017-09-12: 07:00:00
  Filled 2017-09-12: qty 1

## 2017-09-12 MED ORDER — GUAIFENESIN 100 MG/5ML PO SOLN: 200.0000 mg | Freq: Every day | ORAL | Status: DC | PRN

## 2017-09-12 MED ORDER — SERTRALINE HCL 100 MG PO TABS: 100.0000 mg | ORAL_TABLET | Freq: Every day | ORAL | Status: DC

## 2017-09-12 MED ORDER — ENOXAPARIN SODIUM 40 MG/0.4ML ~~LOC~~ SOLN
40.0000 mg | SUBCUTANEOUS | Status: DC
  Administered 2017-09-12 – 2017-09-13 (×2): 40 mg via SUBCUTANEOUS
  Filled 2017-09-12 (×3): qty 0.4

## 2017-09-12 MED ORDER — MELOXICAM 7.5 MG PO TABS
15.0000 mg | ORAL_TABLET | Freq: Every day | ORAL | Status: DC
  Administered 2017-09-13: 15 mg via ORAL
  Filled 2017-09-12: qty 2

## 2017-09-12 MED ORDER — ASPIRIN 300 MG RE SUPP
300.0000 mg | Freq: Every day | RECTAL | Status: DC
  Administered 2017-09-12: 300 mg via RECTAL
  Filled 2017-09-12 (×2): qty 1

## 2017-09-12 MED ORDER — MEMANTINE HCL ER 28 MG PO CP24
28.0000 mg | ORAL_CAPSULE | Freq: Every day | ORAL | Status: DC
  Administered 2017-09-13: 28 mg via ORAL
  Filled 2017-09-12: qty 1

## 2017-09-12 MED ORDER — IOPAMIDOL (ISOVUE-300) INJECTION 61%
INTRAVENOUS | Status: AC
  Administered 2017-09-12: 100 mL
  Filled 2017-09-12: qty 100

## 2017-09-12 MED ORDER — LORATADINE 10 MG PO TABS
10.0000 mg | ORAL_TABLET | Freq: Every day | ORAL | Status: DC
  Administered 2017-09-13: 10 mg via ORAL
  Filled 2017-09-12 (×2): qty 1

## 2017-09-12 MED ORDER — SODIUM CHLORIDE 0.9 % IV SOLN: INTRAVENOUS | Status: AC

## 2017-09-12 MED ORDER — TRAVOPROST (BAK FREE) 0.004 % OP SOLN
1.0000 [drp] | Freq: Every day | OPHTHALMIC | Status: DC
  Administered 2017-09-12: 1 [drp] via OPHTHALMIC
  Filled 2017-09-12: qty 2.5

## 2017-09-12 MED ORDER — DONEPEZIL HCL 10 MG PO TABS
10.0000 mg | ORAL_TABLET | Freq: Every day | ORAL | Status: DC
  Administered 2017-09-12: 10 mg via ORAL
  Filled 2017-09-12: qty 1

## 2017-09-12 MED ORDER — DIVALPROEX SODIUM 250 MG PO DR TAB: 250.0000 mg | DELAYED_RELEASE_TABLET | Freq: Once | ORAL | Status: DC

## 2017-09-12 MED ORDER — BRIMONIDINE TARTRATE 0.2 % OP SOLN
1.0000 [drp] | Freq: Three times a day (TID) | OPHTHALMIC | Status: DC
  Administered 2017-09-12 – 2017-09-13 (×3): 1 [drp] via OPHTHALMIC
  Filled 2017-09-12: qty 5

## 2017-09-12 MED ORDER — SENNOSIDES-DOCUSATE SODIUM 8.6-50 MG PO TABS: 1.0000 | ORAL_TABLET | Freq: Every evening | ORAL | Status: DC | PRN

## 2017-09-12 MED ORDER — INSULIN ASPART 100 UNIT/ML ~~LOC~~ SOLN: 0.0000 [IU] | Freq: Every day | SUBCUTANEOUS | Status: DC

## 2017-09-12 MED ORDER — ACETAMINOPHEN 160 MG/5ML PO SOLN: 650.0000 mg | ORAL | Status: DC | PRN

## 2017-09-12 MED ORDER — ACETAMINOPHEN 650 MG RE SUPP: 650.0000 mg | RECTAL | Status: DC | PRN

## 2017-09-12 MED ORDER — HYDROCODONE-ACETAMINOPHEN 5-325 MG PO TABS: 1.0000 | ORAL_TABLET | ORAL | Status: DC | PRN

## 2017-09-12 MED ORDER — ACETAMINOPHEN 325 MG PO TABS: 650.0000 mg | ORAL_TABLET | ORAL | Status: DC | PRN

## 2017-09-12 MED ORDER — INSULIN ASPART 100 UNIT/ML ~~LOC~~ SOLN
0.0000 [IU] | Freq: Three times a day (TID) | SUBCUTANEOUS | Status: DC
Start: 2017-09-12 — End: 2017-09-13
  Administered 2017-09-12: 3 [IU] via SUBCUTANEOUS
  Administered 2017-09-12 – 2017-09-13 (×3): 2 [IU] via SUBCUTANEOUS

## 2017-09-12 MED ORDER — INSULIN ASPART 100 UNIT/ML ~~LOC~~ SOLN: 4.0000 [IU] | Freq: Once | SUBCUTANEOUS | Status: DC

## 2017-09-12 MED ORDER — FLUTICASONE PROPIONATE 50 MCG/ACT NA SUSP
1.0000 | Freq: Every day | NASAL | Status: DC
  Administered 2017-09-13: 1 via NASAL
  Filled 2017-09-12: qty 16

## 2017-09-12 MED ORDER — SERTRALINE HCL 100 MG PO TABS
100.0000 mg | ORAL_TABLET | Freq: Every day | ORAL | Status: DC
  Administered 2017-09-12: 100 mg via ORAL
  Filled 2017-09-12: qty 1

## 2017-09-12 NOTE — Progress Notes (Signed)
Patient had a 2.09 pause on cardiac monitor.  Patient is comfortable.  Doctor on call notified.  Will continue to monitor

## 2017-09-12 NOTE — Care Management Obs Status (Signed)
[facility] NOTIFICATION   Patient Details  Name: [name] MRN: [id number] Date of Birth: 06-18-1924   Medicare Observation Status Notification Given:  Yes    Pollie Friar, RN 09/12/2017, 2:11 PM

## 2017-09-12 NOTE — ED Notes (Signed)
Patient transported to MRI 

## 2017-09-12 NOTE — Progress Notes (Signed)
Patient seen and examined.  Admitted after midnight secondary to brief episodes of difficulty speaking and abnormal gait.  Symptoms subsequently improved by the time EMS got to the house and the patient was seen in the ED.  TIA suspected.  CT scan without acute intracranial abnormalities.  Patient has been admitted for TIA workup.  Please refer to H&P written by Dr. Myna Hidalgo for further info and details on her admission.  On my examination patient is a stable, per family at bedside back to her baseline mentation wise.  Denies chest pain, no shortness of breath, no nausea, no vomiting, no abdominal pain, no dysuria or hematuria.  There was presence of some mild dry cough intermittently present that apparently she has been experiencing for the last 2 weeks or so.  Barton Dubois MD [phone]

## 2017-09-12 NOTE — Evaluation (Signed)
Physical Therapy Evaluation Patient Details Name: [name] MRN: [id number] DOB: 1923/11/12 Today's Date: 09/12/2017   History of Present Illness  83 y.o. female with medical history significant for dementia, type II DM, and hypertension, now presenting to the ED for evaluation of gait and speech difficulty. MRI revealed No acute intracranial abnormality.  TIA suspected.   Clinical Impression  Pt admitted with above diagnosis. Pt currently with functional limitations due to the deficits listed below (see PT Problem List). Pt is limited in her safe mobility by increased generalized weakness, decreased safety awareness and decreased cognition especially when it comes to novel use of RW. Pt is currently supervision for bed mobility and minA for transfers and ambulation of 18 feet with RW. Pt will benefit from skilled PT to increase their independence and safety with mobility to allow discharge to the venue listed below.        Follow Up Recommendations Home health PT;Supervision/Assistance - 24 hour    Equipment Recommendations  Rolling walker with 5" wheels(youth walker)    Recommendations for Other Services OT consult     Precautions / Restrictions Precautions Precautions: Fall Restrictions Weight Bearing Restrictions: No      Mobility  Bed Mobility Overal bed mobility: Needs Assistance Bed Mobility: Supine to Sit;Sit to Supine     Supine to sit: Supervision Sit to supine: Supervision   General bed mobility comments: supervision for safety, use of bed rail to come to EoB  Transfers Overall transfer level: Needs assistance Equipment used: None;Rolling walker (2 wheeled) Transfers: Sit to/from Stand Sit to Stand: Min assist         General transfer comment: minA for for sit>stand for EoB, pt unable to steady herself sat back down and educated on hand placement for RW use to power up and steadying, pt still required minA but was able to steady herself with  walker, minA for power up from toilet  Ambulation/Gait Ambulation/Gait assistance: Min assist Ambulation Distance (Feet): 18 Feet Assistive device: Rolling walker (2 wheeled) Gait Pattern/deviations: Step-through pattern;Decreased step length - right;Decreased step length - left;Shuffle;Trunk flexed Gait velocity: slowed Gait velocity interpretation: Below normal speed for age/gender General Gait Details: minA for steadying with RW, maximal vc for sequencing and staying within walker as pt has never used walker before      Balance Overall balance assessment: Needs assistance Sitting-balance support: No upper extremity supported;Feet supported Sitting balance-Leahy Scale: Good     Standing balance support: Bilateral upper extremity supported;Single extremity supported Standing balance-Leahy Scale: Poor Standing balance comment: requires assist to maintain balance                             Pertinent Vitals/Pain Pain Assessment: No/denies pain    Home Living Family/patient expects to be discharged to:: Private residence Living Arrangements: Children(son and daughter in-law) Available Help at Discharge: Family;Available 24 hours/day Type of Home: House Home Access: Stairs to enter Entrance Stairs-Rails: Right Entrance Stairs-Number of Steps: 5 Home Layout: Two level;Able to live on main level with bedroom/bathroom;Other (Comment)(never goes up stairs) Home Equipment: Shower seat;Toilet riser;Grab bars - tub/shower;Grab bars - toilet      Prior Function Level of Independence: Needs assistance   Gait / Transfers Assistance Needed: prior to admission ambulated without AD  ADL's / Homemaking Assistance Needed: assist with bathing, dressing and iADLs           Extremity/Trunk Assessment   Upper Extremity Assessment Upper Extremity  Assessment: Defer to OT evaluation    Lower Extremity Assessment Lower Extremity Assessment: Generalized weakness;Difficult to  assess due to impaired cognition       Communication   Communication: No difficulties  Cognition Arousal/Alertness: Awake/alert Behavior During Therapy: WFL for tasks assessed/performed Overall Cognitive Status: History of cognitive impairments - at baseline                                 General Comments: oriented to self, difficulty following multi-step commands,       General Comments General comments (skin integrity, edema, etc.): Son and daughter in-law present and provided house layout and history        Assessment/Plan    PT Assessment Patient needs continued PT services  PT Problem List Decreased strength;Decreased activity tolerance;Decreased balance;Decreased mobility;Decreased cognition;Decreased knowledge of use of DME;Decreased safety awareness       PT Treatment Interventions DME instruction;Gait training;Stair training;Functional mobility training;Therapeutic activities;Therapeutic exercise;Balance training;Cognitive remediation;Patient/family education    PT Goals (Current goals can be found in the Care Plan section)  Acute Rehab PT Goals Patient Stated Goal: none stated PT Goal Formulation: With patient/family Time For Goal Achievement: 09/26/17 Potential to Achieve Goals: Fair    Frequency Min 4X/week    AM-PAC PT "6 Clicks" Daily Activity  Outcome Measure Difficulty turning over in bed (including adjusting bedclothes, sheets and blankets)?: A Little Difficulty moving from lying on back to sitting on the side of the bed? : A Lot Difficulty sitting down on and standing up from a chair with arms (e.g., wheelchair, bedside commode, etc,.)?: Unable Help needed moving to and from a bed to chair (including a wheelchair)?: A Little Help needed walking in hospital room?: A Little Help needed climbing 3-5 steps with a railing? : A Lot 6 Click Score: 14    End of Session Equipment Utilized During Treatment: Gait belt Activity Tolerance:  Patient tolerated treatment well Patient left: in bed;with call bell/phone within reach;with family/visitor present;with bed alarm set Nurse Communication: Mobility status PT Visit Diagnosis: Unsteadiness on feet (R26.81);Other abnormalities of gait and mobility (R26.89);Muscle weakness (generalized) (M62.81);Difficulty in walking, not elsewhere classified (R26.2)    Time: 4401-0272 PT Time Calculation (min) (ACUTE ONLY): 36 min   Charges:   PT Evaluation $PT Eval Moderate Complexity: 1 Mod PT Treatments $Therapeutic Activity: 8-22 mins   PT G Codes:   PT G-Codes **NOT FOR INPATIENT CLASS** Functional Assessment Tool Used: AM-PAC 6 Clicks Basic Mobility Functional Limitation: Mobility: Walking and moving around Mobility: Walking and Moving Around Current Status (Z3664): At least 40 percent but less than 60 percent impaired, limited or restricted Mobility: Walking and Moving Around Goal Status [phone]): At least 1 percent but less than 20 percent impaired, limited or restricted    Benjamine Mola B. Migdalia Dk PT, DPT Acute Rehabilitation  [phone] Pager [phone]    [place] 09/12/2017, 9:49 AM

## 2017-09-12 NOTE — Progress Notes (Signed)
Carotid duplex prelim: 1-39% ICA stenosis.  [name] Eunice, RDMS, RVT   

## 2017-09-12 NOTE — Progress Notes (Signed)
83 years female received vis stretcher from ed to [address] accompanied by son and daughter- in [employer] . Pt and family oriented to staff and use of call light.  Cardiac monitor applied and ferification complete.  Denied any c/o  .

## 2017-09-12 NOTE — Care Management Note (Signed)
Case Management Note  Patient Details  Name: Chelsa Stout MRN: [id number] Date of Birth: November 15, 1923  Subjective/Objective:                    Action/Plan: PT recommending [facility] services. CM spoke to the patients son and daughter in law and they asked to use [facility]. Brad with [facility] informed. MD notified for need of [place] orders. Pt with orders for rolling walker. Brad with [facility] notified and will deliver the equipment to the room.   Expected Discharge Date:                  Expected Discharge Plan:  [place]  In-House Referral:     Discharge planning Services  CM Consult  Post Acute Care Choice:  Home Health Choice offered to:     DME Arranged:    DME Agency:     HH Arranged:    [place] Agency:  [facility]  Status of Service:  In process, will continue to follow  If discussed at Long Length of Stay Meetings, dates discussed:    Additional Comments:  Pollie Friar, RN 09/12/2017, 3:45 PM

## 2017-09-12 NOTE — Progress Notes (Signed)
  Echocardiogram 2D Echocardiogram has been performed.  Merrie Roof F 09/12/2017, 1:17 PM

## 2017-09-12 NOTE — Progress Notes (Signed)
SLP Cancellation Note  Patient Details Name: [name] MRN: [id number] DOB: November 13, 1923   Cancelled treatment:       Reason Eval/Treat Not Completed: SLP screened, no needs identified, will sign off. Pt with baseline dementia, reportedly speech returned to baseline and MRI is negative. Will defer cognitive linguistic eval.    Darci Lykins, Katherene Ponto 09/12/2017, 9:28 AM

## 2017-09-12 NOTE — H&P (Signed)
History and Physical    [name] CHE:[id number] DOB: [birth year] DOA: 09/11/2017  PCP: Merrilee Seashore, MD   Patient coming from: Home  Chief Complaint: Transient speech and gait difficulty  HPI: [name] is a 83 y.o. female with medical history significant for dementia, type II DM, and hypertension, now presenting to the ED for evaluation of gait and speech difficulty. History is primarily obtained through discussion with patient's son at bedside, EMS report, discussion with ED personnel, and review of EMR. She had reportedly been suffering from a non-productive cough for the past week or so, but otherwise in her usual state until this evening when she had an acute episode marked by abdominal pain complaint, then staring-off for ~30 seconds, and followed by incomprehensible speech and inability to ambulate without intensive assistance. She has never had a similar episode previously.    ED Course: Upon arrival to the ED, patient is found to be afebrile, saturating well on room air, and with vitals stable.  EKG features a sinus rhythm with LAFB and anterior T wave inversions, similar to prior.  Chest x-ray is notable for developing soft tissue process in the right paratracheal region concerning for developing mass or adenopathy.  Noncontrast head CT is negative for acute intracranial abnormality.  CT of the abdomen and pelvis is negative for acute pathology.  Chemistry panel reveals a mild hyponatremia and hyperglycemia.  CBC is notable for a leukocytosis at 11,800.  Urinalysis features glucosuria, but no evidence for infection.  CT of the chest was obtained to further evaluate the abnormality noted on chest x-ray, and reveals a 2.8 cm nodule in the right thyroid lobe.  Patient remained hemodynamically stable and in no apparent distress in the ED, and she will be observed on the telemetry unit for ongoing evaluation and management of suspected TIA.   Review of Systems:    Unable to complete ROS secondary to patient's clinical condition with advanced dementia .  Past Medical History:  Diagnosis Date  . Dementia   . Diabetes mellitus without complication ([place])   . Glaucoma   . Hypertension     History reviewed. No pertinent surgical history.   reports that  has never smoked. she has never used smokeless tobacco. She reports that she does not drink alcohol or use drugs.  No Known Allergies  History reviewed. No pertinent family history.   Prior to Admission medications   Medication Sig Start Date End Date Taking? Authorizing Provider  amLODipine (NORVASC) 5 MG tablet Take 5 mg by mouth daily.    [provider]  Brinzolamide-Brimonidine 1-0.2 % SUSP Apply 1 drop to eye 3 (three) times daily.    [provider]  divalproex (DEPAKOTE) 250 MG DR tablet Take 250 mg by mouth 2 (two) times daily.    [provider]  donepezil (ARICEPT) 10 MG tablet Take 10 mg by mouth at bedtime.    [provider]  glipiZIDE (GLUCOTROL) 5 MG tablet Take 5 mg by mouth daily before breakfast.    [provider]  guaiFENesin (ROBITUSSIN) 100 MG/5ML liquid Take 200 mg by mouth daily as needed for cough.    [provider]  HYDROcodone-acetaminophen (NORCO/VICODIN) 5-325 MG per tablet Take 1 tablet by mouth every 4 (four) hours as needed for moderate pain. 12/19/14   Jola Schmidt, MD  meloxicam (MOBIC) 15 MG tablet Take 15 mg by mouth daily.    [provider]  memantine (NAMENDA XR) 28 MG CP24 24 hr  capsule Take 28 mg by mouth daily.    [provider]  sertraline (ZOLOFT) 100 MG tablet Take 100 mg by mouth at bedtime.    [provider]  travoprost, benzalkonium, (TRAVATAN) 0.004 % ophthalmic solution Place 1 drop into both eyes at bedtime.    [provider]    Physical Exam: Vitals:   09/12/17 0115 09/12/17 0130 09/12/17 0145 09/12/17 0200  BP:      Pulse: 74 72 79 78  Resp: 14 17  16  (!) 21  Temp:      TempSrc:      SpO2: 100% 100% 100% 100%      Constitutional: NAD, calm, chronically-ill appearing Eyes: PERTLA, lids and conjunctivae normal ENMT: Mucous membranes are moist. Posterior pharynx clear of any exudate or lesions.   Neck: normal, supple, no masses, no thyromegaly Respiratory: clear to auscultation bilaterally, no wheezing, frequent coughing. Normal respiratory effort.   Cardiovascular: S1 & S2 heard, regular rate and rhythm. No significant JVD. Abdomen: No distension, no tenderness, no masses palpated. Bowel sounds normal.  Musculoskeletal: no clubbing / cyanosis. No joint deformity upper and lower extremities.   Skin: no significant rashes, lesions, ulcers. Warm, dry, well-perfused. Neurologic: CN 2-12 grossly intact. Sensation intact. Strength 5/5 in all 4 limbs.  Psychiatric: Alert and oriented to person only. Calm, cooperative.     Labs on Admission: I have personally reviewed following labs and imaging studies  CBC: Recent Labs  Lab 09/11/17 2305  WBC 11.8*  NEUTROABS 10.7*  HGB 14.0  HCT 42.0  MCV 91.1  PLT 209   Basic Metabolic Panel: Recent Labs  Lab 09/11/17 2305  NA 133*  K 4.5  CL 102  CO2 24  GLUCOSE 259*  BUN 15  CREATININE 0.91  CALCIUM 9.0   GFR: CrCl cannot be calculated (Unknown ideal weight.). Liver Function Tests: Recent Labs  Lab 09/11/17 2305  AST 25  ALT 19  ALKPHOS 76  BILITOT 1.5*  PROT 7.1  ALBUMIN 3.9   Recent Labs  Lab 09/11/17 2305  LIPASE 23   No results for input(s): AMMONIA in the last 168 hours. Coagulation Profile: No results for input(s): INR, PROTIME in the last 168 hours. Cardiac Enzymes: No results for input(s): CKTOTAL, CKMB, CKMBINDEX, TROPONINI in the last 168 hours. BNP (last 3 results) No results for input(s): PROBNP in the last 8760 hours. HbA1C: No results for input(s): HGBA1C in the last 72 hours. CBG: Recent Labs  Lab 09/11/17 2318  GLUCAP 252*   Lipid  Profile: No results for input(s): CHOL, HDL, LDLCALC, TRIG, CHOLHDL, LDLDIRECT in the last 72 hours. Thyroid Function Tests: No results for input(s): TSH, T4TOTAL, FREET4, T3FREE, THYROIDAB in the last 72 hours. Anemia Panel: No results for input(s): VITAMINB12, FOLATE, FERRITIN, TIBC, IRON, RETICCTPCT in the last 72 hours. Urine analysis:    Component Value Date/Time   COLORURINE YELLOW 09/12/2017 0124   APPEARANCEUR CLEAR 09/12/2017 0124   LABSPEC 1.020 09/12/2017 0124   PHURINE 5.0 09/12/2017 0124   GLUCOSEU >=500 (A) 09/12/2017 0124   HGBUR SMALL (A) 09/12/2017 0124   BILIRUBINUR NEGATIVE 09/12/2017 0124   KETONESUR NEGATIVE 09/12/2017 0124   PROTEINUR NEGATIVE 09/12/2017 0124   UROBILINOGEN 1.0 03/22/2011 1714   NITRITE NEGATIVE 09/12/2017 0124   LEUKOCYTESUR TRACE (A) 09/12/2017 0124   Sepsis Labs: @LABRCNTIP (procalcitonin:4,lacticidven:4) )No results found for this or any previous visit (from the past 240 hour(s)).   Radiological Exams on Admission: Dg Chest 2 View  Result Date: 09/12/2017  CLINICAL DATA:  Cough for 1 day EXAM: CHEST  2 VIEW COMPARISON:  12/19/2014 FINDINGS: Mild cardiac enlargement. No pulmonary vascular congestion or edema. No blunting of costophrenic angles. No pneumothorax. Since the previous study, there is interval development of a 3.6 cm soft tissue structure in the right paratracheal region. This is worrisome for developing mass or lymphadenopathy. CT chest recommended for further evaluation. Calcified and tortuous aorta. No pneumothorax. IMPRESSION: Developing soft tissue process in the right paratracheal region may represent developing mass or lymphadenopathy. CT chest recommended for further evaluation. Cardiac enlargement without vascular congestion or edema. Electronically Signed   By: Lucienne Capers M.D.   On: 09/12/2017 00:41   Ct Head Wo Contrast  Result Date: 09/12/2017 CLINICAL DATA:  Mild confusion and memory loss. Sweating and high blood  sugar. History of dementia. No focal neurological defect. EXAM: CT HEAD WITHOUT CONTRAST TECHNIQUE: Contiguous axial images were obtained from the base of the skull through the vertex without intravenous contrast. COMPARISON:  None. FINDINGS: Brain: Mild diffuse cerebral atrophy. Ventricular dilatation consistent with central atrophy. Patchy low-attenuation changes in the deep white matter consistent with small vessel ischemia. No mass-effect or midline shift. No abnormal extra-axial fluid collections. Gray-white matter junctions are distinct. Basal cisterns are not effaced. No acute intracranial hemorrhage. Vascular: Intracranial arterial vascular calcifications are present. Skull: Calvarium appears intact. Sinuses/Orbits: Mucosal thickening in the paranasal sinuses. No acute air-fluid levels. Mastoid air cells are clear. Other: None. IMPRESSION: No acute intracranial abnormalities. Chronic atrophy and small vessel ischemic changes. Electronically Signed   By: Lucienne Capers M.D.   On: 09/12/2017 01:19   Ct Abdomen Pelvis W Contrast  Result Date: 09/12/2017 CLINICAL DATA:  Confusion, diaphoresis and hyperglycemia. Left upper abdominal pain. EXAM: CT ABDOMEN AND PELVIS WITH CONTRAST TECHNIQUE: Multidetector CT imaging of the abdomen and pelvis was performed using the standard protocol following bolus administration of intravenous contrast. CONTRAST:  154mL ISOVUE-300 IOPAMIDOL (ISOVUE-300) INJECTION 61% COMPARISON:  None. FINDINGS: Lower chest: No pulmonary nodules or pleural effusion. No visible pericardial effusion. Hepatobiliary: Small right hepatic lobe cyst. No focal hepatic abnormality. No biliary dilatation. Normal gallbladder. Pancreas: Normal contours without ductal dilatation. No peripancreatic fluid collection. Spleen: Normal. Adrenals/Urinary Tract: --Adrenal glands: Normal. --Right kidney/ureter: Subcentimeter renal hypodensities are likely cysts. No solid mass. No nephrolithiasis. No  hydronephrosis. --Left kidney/ureter: No hydronephrosis or perinephric stranding. No nephrolithiasis. No obstructing ureteral stones. --Urinary bladder: Unremarkable. Stomach/Bowel: --Stomach/Duodenum: Patulous distal esophagus. No hiatal hernia. Normal duodenal course. --Small bowel: No dilatation or inflammation. --Colon: No focal abnormality. --Appendix: Normal. Vascular/Lymphatic: Atherosclerotic calcification is present within the non-aneurysmal abdominal aorta, without hemodynamically significant stenosis. No abdominal or pelvic lymphadenopathy. Reproductive: Status post hysterectomy. No adnexal mass. Musculoskeletal. No bony spinal canal stenosis or focal osseous abnormality. Other: None. IMPRESSION: 1. No acute abdominopelvic abnormality. 2.  Aortic Atherosclerosis (ICD10-I70.0). Electronically Signed   By: Ulyses Jarred M.D.   On: 09/12/2017 01:30    EKG: Independently reviewed. Sinus rhythm, LAFB, anterior T-wave inversion, similar to prior.   Assessment/Plan  1. TIA  - Pt presents with transient speech and gait difficulty, improving by time of EMS arrival, and returned to baseline in ED  - Head CT is negative for acute intracranial abnormality - Risk factors include HTN and DM  - Plan to continue cardiac monitoring, obtain MRI brain, MRA head, carotid dopplers, echocardiogram, fasting lipid panel, and A1c, start ASA, continue supportive care   2. Type II DM  - No recent A1c on file  -  Managed with glipizide at home, held on admission  - Follow CBG's and use Novolog per sliding-scale    3. Hypertension  - BP at goal  - Hold Norvasc while assessing for acute TIA/CVA    4. Dementia  - Stable per family; at baseline, she is able to ambulate, feed self, and is conversant but confused  - Continue Namenda and Aricept   5. Hyponatremia  - Serum sodium is 133 on admission   - Pt appears dry on admission and is started on NS infusion  - Repeat chem panel in am    6. Incidental thyroid  nodule  - Right paratracheal density noted on chest x-ray, followed-up with CT chest that reveals a 2.8 cm right thyroid lobe nodule  - Can be further evaluated with thyroid ultrasound as deemed appropriate    DVT prophylaxis: Lovenox Code Status: Full  Family Communication: Son updated at bedside Disposition Plan: Observe on telemetry Consults called: None Admission status: Observation    Vianne Bulls, MD [facility] Pager [phone]  If 7PM-7AM, please contact night-coverage [email or web address] Password [facility]  09/12/2017, 2:46 AM

## 2017-09-13 DIAGNOSIS — I10 Essential (primary) hypertension: Secondary | ICD-10-CM

## 2017-09-13 DIAGNOSIS — F0390 Unspecified dementia without behavioral disturbance: Secondary | ICD-10-CM

## 2017-09-13 DIAGNOSIS — E119 Type 2 diabetes mellitus without complications: Secondary | ICD-10-CM | POA: Diagnosis not present

## 2017-09-13 DIAGNOSIS — R05 Cough: Secondary | ICD-10-CM

## 2017-09-13 DIAGNOSIS — K219 Gastro-esophageal reflux disease without esophagitis: Secondary | ICD-10-CM | POA: Diagnosis not present

## 2017-09-13 DIAGNOSIS — J309 Allergic rhinitis, unspecified: Secondary | ICD-10-CM | POA: Diagnosis not present

## 2017-09-13 DIAGNOSIS — R059 Cough, unspecified: Secondary | ICD-10-CM

## 2017-09-13 DIAGNOSIS — G459 Transient cerebral ischemic attack, unspecified: Secondary | ICD-10-CM | POA: Diagnosis not present

## 2017-09-13 LAB — TROPONIN I
Troponin I: <0.03 ng/mL (ref <0.03)
Troponin I: <0.03 ng/mL (ref <0.03)

## 2017-09-13 LAB — MAGNESIUM: Magnesium: 1.7 mg/dL (ref 1.7–2.4)

## 2017-09-13 LAB — GLUCOSE, CAPILLARY: Glucose-Capillary: 144 mg/dL — ABNORMAL HIGH (ref 65–99)

## 2017-09-13 MED ORDER — LORATADINE 10 MG PO TABS: 10.0000 mg | ORAL_TABLET | Freq: Every day | ORAL | 0 refills | Status: DC

## 2017-09-13 MED ORDER — FLUTICASONE PROPIONATE 50 MCG/ACT NA SUSP: 1.0000 | Freq: Every day | NASAL | 0 refills | Status: DC

## 2017-09-13 MED ORDER — PANTOPRAZOLE SODIUM 40 MG PO TBEC: 40.0000 mg | DELAYED_RELEASE_TABLET | Freq: Every day | ORAL | 1 refills | Status: DC

## 2017-09-13 MED ORDER — GUAIFENESIN 100 MG/5ML PO SOLN: 200.0000 mg | Freq: Three times a day (TID) | ORAL | 0 refills | Status: DC | PRN

## 2017-09-13 MED ORDER — ASPIRIN 325 MG PO TABS: 325.0000 mg | ORAL_TABLET | Freq: Every day | ORAL | 1 refills | Status: DC

## 2017-09-13 NOTE — Evaluation (Signed)
Occupational Therapy Evaluation Patient Details Name: [name] MRN: [id number] DOB: 09-21-1924 Today's Date: 09/13/2017    History of Present Illness 83 y.o. female with medical history significant for dementia, type II DM, and hypertension, now presenting to the ED for evaluation of gait and speech difficulty. MRI revealed No acute intracranial abnormality.  TIA suspected.    Clinical Impression   Unsure of PLOF; pt is a poor historian and no family present. Per PT eval; pt required some assist with ADL PTA. Currently pt requires min assist for functional mobility and ADL with max cues for sequencing and safety. Pt presenting with impaired cognition, balance deficits, generalized weakness, poor safety awareness impacting her independence and safety with ADL and functional mobility. Pt planning to d/c home with assist from family. Recommending HHOT and 24/7 supervision for follow up to maximize independence and safety with ADL and functional mobility upon return home. Pt would benefit from continued skilled OT to address established goals.    Follow Up Recommendations  Home health OT;Supervision/Assistance - 24 hour    Equipment Recommendations  None recommended by OT    Recommendations for Other Services       Precautions / Restrictions Precautions Precautions: Fall Restrictions Weight Bearing Restrictions: No      Mobility Bed Mobility Overal bed mobility: Needs Assistance Bed Mobility: Supine to Sit     Supine to sit: Supervision     General bed mobility comments: supervision for safety. Increased time and effort but no physical assist required  Transfers Overall transfer level: Needs assistance Equipment used: Rolling walker (2 wheeled) Transfers: Sit to/from Stand Sit to Stand: Min assist         General transfer comment: min assist to boost up from EOB and toilet x1 each, cues for hand placement and safety. Min assist for steadying balance once in  standing    Balance Overall balance assessment: Needs assistance Sitting-balance support: Feet supported;No upper extremity supported Sitting balance-Leahy Scale: Fair     Standing balance support: No upper extremity supported;During functional activity Standing balance-Leahy Scale: Poor Standing balance comment: min assist for standing balance without UE support                           ADL either performed or assessed with clinical judgement   ADL Overall ADL's : Needs assistance/impaired Eating/Feeding: Set up;Sitting   Grooming: Minimal assistance;Standing;Wash/dry hands Grooming Details (indicate cue type and reason): min assist for balance Upper Body Bathing: Min guard;Sitting   Lower Body Bathing: Minimal assistance;Sit to/from stand   Upper Body Dressing : Min guard;Sitting   Lower Body Dressing: Minimal assistance;Sit to/from stand   Toilet Transfer: Minimal assistance;Ambulation;Regular Toilet;RW;Cueing for safety;Cueing for sequencing Toilet Transfer Details (indicate cue type and reason): max cues for sequencing and safety with RW Toileting- Clothing Manipulation and Hygiene: Minimal assistance;Sit to/from stand Toileting - Clothing Manipulation Details (indicate cue type and reason): for peri care     Functional mobility during ADLs: Minimal assistance;Rolling walker       Vision   Additional Comments: Difficult to assess due to impaired cognition. Pt with difficulty finding items on L side during functional task and running into things on L side when ambulating with RW.     Perception     Praxis      Pertinent Vitals/Pain Pain Assessment: No/denies pain     Hand Dominance Right   Extremity/Trunk Assessment Upper Extremity Assessment Upper Extremity Assessment: Generalized weakness;Difficult  to assess due to impaired cognition   Lower Extremity Assessment Lower Extremity Assessment: Defer to PT evaluation       Communication  Communication Communication: No difficulties   Cognition Arousal/Alertness: Awake/alert Behavior During Therapy: WFL for tasks assessed/performed Overall Cognitive Status: No family/caregiver present to determine baseline cognitive functioning                                     General Comments       Exercises     Shoulder Instructions      Home Living Family/patient expects to be discharged to:: Private residence Living Arrangements: Children(son and daughter in law) Available Help at Discharge: Family;Available 24 hours/day Type of Home: House Home Access: Stairs to enter [employer] of Steps: 5 Entrance Stairs-Rails: Right Home Layout: Two level;Able to live on main level with bedroom/bathroom;Other (Comment)     Bathroom Shower/Tub: Walk-in shower;Door   [employer] Toilet: Handicapped height     Home Equipment: Civil engineer, contracting;Toilet riser;Grab bars - tub/shower;Grab bars - toilet   Additional Comments: Pt is a poor historian, no family present. Information obtained from PT eval with family confirmation of home set up and PLOF.      Prior Functioning/Environment Level of Independence: Needs assistance  Gait / Transfers Assistance Needed: prior to admission ambulated without AD ADL's / Homemaking Assistance Needed: assist with bathing, dressing and iADLs            OT Problem List: Decreased strength;Decreased activity tolerance;Impaired balance (sitting and/or standing);Decreased cognition;Decreased safety awareness;Decreased knowledge of use of DME or AE      OT Treatment/Interventions: Self-care/ADL training;Therapeutic exercise;Energy conservation;DME and/or AE instruction;Therapeutic activities;Patient/family education;Balance training    OT Goals(Current goals can be found in the care plan section) Acute Rehab OT Goals Patient Stated Goal: none stated OT Goal Formulation: With patient Time For Goal Achievement: 09/27/17 Potential  to Achieve Goals: Good ADL Goals Pt Will Perform Grooming: with supervision;standing Pt Will Perform Upper Body Bathing: with supervision;sitting Pt Will Perform Lower Body Bathing: with supervision;sit to/from stand Pt Will Transfer to Toilet: with supervision;ambulating;bedside commode Pt Will Perform Toileting - Clothing Manipulation and hygiene: with supervision;sit to/from stand Pt Will Perform Tub/Shower Transfer: Tub transfer;with min guard assist;ambulating;shower seat;grab bars;rolling walker  OT Frequency: Min 2X/week   Barriers to D/C:            Co-evaluation              AM-PAC PT "6 Clicks" Daily Activity     Outcome Measure Help from another person eating meals?: A Little Help from another person taking care of personal grooming?: A Little Help from another person toileting, which includes using toliet, bedpan, or urinal?: A Little Help from another person bathing (including washing, rinsing, drying)?: A Little Help from another person to put on and taking off regular upper body clothing?: A Little Help from another person to put on and taking off regular lower body clothing?: A Little 6 Click Score: 18   End of Session Equipment Utilized During Treatment: Rolling walker;Gait belt  Activity Tolerance: Patient tolerated treatment well Patient left: in chair;with call bell/phone within reach;with chair alarm set  OT Visit Diagnosis: Unsteadiness on feet (R26.81);Other abnormalities of gait and mobility (R26.89);Muscle weakness (generalized) (M62.81)                Time: 8786-7672 OT Time Calculation (min): 23 min Charges:  OT  General Charges $OT Visit: 1 Visit OT Evaluation $OT Eval Moderate Complexity: 1 Mod OT Treatments $Self Care/Home Management : 8-22 mins G-Codes: OT G-codes **NOT FOR INPATIENT CLASS** Functional Assessment Tool Used: Clinical judgement Functional Limitation: Self care Self Care Current Status (N8676): At least 20 percent but less  than 40 percent impaired, limited or restricted Self Care Goal Status (H2094): At least 1 percent but less than 20 percent impaired, limited or restricted   Mel Almond A. Ulice Brilliant, M.S., OTR/L Pager: [place] 09/13/2017, 10:20 AM

## 2017-09-13 NOTE — Progress Notes (Signed)
Patient D/C home, no new concern, pt is stable. D/C instruction with teach back given to patient family. Pt is transported from hospital by family.

## 2017-09-13 NOTE — Discharge Summary (Signed)
Physician Discharge Summary  [name] MGQ:[id number] DOB: Dec 16, 1923 DOA: 09/11/2017  PCP: Merrilee Seashore, MD  Admit date: 09/11/2017 Discharge date: 09/13/2017  Time spent: 35 minutes  Recommendations for Outpatient Follow-up:  1. Repeat BMET to follow electrolytes and renal function  2. Follow thyroid panel and thyroid US; referral to endocrinology as deemed appropriate.   Discharge Diagnoses:  Principal Problem:   TIA (transient ischemic attack) Active Problems:   Hypertension   Diabetes mellitus without complication (HCC)   Dementia   Hyponatremia   Incidentaloma of thyroid gland   Cough   Allergic rhinitis   Gastroesophageal reflux disease   Discharge Condition: stable and improved. Discharge home with instructions to follow up with PCP in 10 days.  Diet recommendation: heart healthy and modified carbohydrates   Filed Weights   09/12/17 0554  Weight: 73.4 kg (161 lb 13.1 oz)    History of present illness:  As per H&P written by Dr. Myna Hidalgo on 11/23 83 y.o. female with medical history significant for dementia, type II DM, and hypertension, now presenting to the ED for evaluation of gait and speech difficulty. History is primarily obtained through discussion with patient's son at bedside, EMS report, discussion with ED personnel, and review of EMR. She had reportedly been suffering from a non-productive cough for the past week or so, but otherwise in her usual state until this evening when she had an acute episode marked by abdominal pain complaint, then staring-off for ~30 seconds, and followed by incomprehensible speech and inability to ambulate without intensive assistance. She has never had a similar episode previously.     Hospital Course:  1. TIA  -Pt presents with transient speech and gait difficulty, improving by time of EMS arrival, and returned to baseline in ED  -Head CT is negative for acute intracranial abnormality -MRI also r/o any ischemic  abnormalities. -continue risk factors modifications -will start patient on full dose ASA for secondary prevention.  2. Type II DM  -A1C 7.0 -continue glipizide and modified carbohydrates   -Follow CBG's and A1C as an outpatient; further adjustment to hypoglycemic regimen as needed  3. Hypertension  -BP at goal  -continue current antihypertensive regimen  4. Dementia  -Stable per family and at baseline -some intermittent mild confusion and repetitive question -will contiue supportive care and continue Namenda and Aricept   5. Hyponatremia  -Serum sodium is 136 corrected -advise to maintain appropriate hydration   6. Incidental thyroid nodule  -Right paratracheal density noted on chest x-ray, followed-up with CT chest that reveals a 2.8 cm right thyroid lobe nodule  -Can be further evaluated as an outpatient with thyroid panel and ultrasound as deemed appropriate   7. Allergic rhinitis -will discharge on loratadine and flonase  8. GERD and GI prophylaxis -will use protonix daily  Procedures:  MRI: no acute intracranial abnormalities   Carotid duplex: 1-39% ICA stenosis  2-D echo:  - Left ventricle: The cavity size was normal. Wall thickness was   increased in a pattern of mild LVH. Systolic function was normal.   The estimated ejection fraction was in the range of 55% to 60%.   Wall motion was normal; there were no regional wall motion   abnormalities. Left ventricular diastolic function parameters   were normal. - Atrial septum: A patent foramen ovale cannot be excluded. - Pulmonary arteries: PA peak pressure: 33 mm Hg (S).  Consultations:  None   Discharge Exam: Vitals:   09/13/17 0123 09/13/17 0514  BP: (!) 163/63 Marland Kitchen)  147/57  Pulse: 68 71  Resp: 18 18  Temp: 99.1 F (37.3 C) 98.4 F (36.9 C)  SpO2: 99% 95%    General: AAOX2, following commands appropriately, no CP, no SOB, no nausea, no vomiting. Cardiovascular: S1 and S2, no rubs, no  gallops Respiratory: CTA bilaterally Abd: soft, NT, ND, positive BS Extremities: no edema or cyanosis  Neuro: patient moving four limbs spontaneously; no focal mor deficit appreciated. CN intact   Discharge Instructions    Current Discharge Medication List    CONTINUE these medications which have NOT CHANGED   Details  amLODipine (NORVASC) 5 MG tablet Take 5 mg by mouth daily.    Brinzolamide-Brimonidine 1-0.2 % SUSP Apply 1 drop to eye 2 (two) times daily.     divalproex (DEPAKOTE) 250 MG DR tablet Take 250 mg by mouth 2 (two) times daily.    glipiZIDE (GLUCOTROL) 5 MG tablet Take 5 mg by mouth daily before breakfast.    meloxicam (MOBIC) 7.5 MG tablet Take 7.5 mg by mouth daily.     Memantine HCl-Donepezil HCl (NAMZARIC) 28-10 MG CP24 Take 1 capsule by mouth daily.    travoprost, benzalkonium, (TRAVATAN) 0.004 % ophthalmic solution Place 1 drop into both eyes at bedtime.    HYDROcodone-acetaminophen (NORCO/VICODIN) 5-325 MG per tablet Take 1 tablet by mouth every 4 (four) hours as needed for moderate pain. Qty: 15 tablet, Refills: 0    sertraline (ZOLOFT) 100 MG tablet Take 100 mg by mouth at bedtime.      STOP taking these medications     donepezil (ARICEPT) 10 MG tablet      guaiFENesin (ROBITUSSIN) 100 MG/5ML liquid      memantine (NAMENDA XR) 28 MG CP24 24 hr capsule        No Known Allergies Follow-up Information    Merrilee Seashore, MD. Schedule an appointment as soon as possible for a visit in 10 day(s).   Specialty:  Internal Medicine Contact information: [address] [place] [address] [phone]           The results of significant diagnostics from this hospitalization (including imaging, microbiology, ancillary and laboratory) are listed below for reference.    Significant Diagnostic Studies: Dg Chest 2 View  Result Date: 09/12/2017 CLINICAL DATA:  Cough for 1 day EXAM: CHEST  2 VIEW COMPARISON:  12/19/2014  FINDINGS: Mild cardiac enlargement. No pulmonary vascular congestion or edema. No blunting of costophrenic angles. No pneumothorax. Since the previous study, there is interval development of a 3.6 cm soft tissue structure in the right paratracheal region. This is worrisome for developing mass or lymphadenopathy. CT chest recommended for further evaluation. Calcified and tortuous aorta. No pneumothorax. IMPRESSION: Developing soft tissue process in the right paratracheal region may represent developing mass or lymphadenopathy. CT chest recommended for further evaluation. Cardiac enlargement without vascular congestion or edema. Electronically Signed   By: Lucienne Capers M.D.   On: 09/12/2017 00:41   Ct Head Wo Contrast  Result Date: 09/12/2017 CLINICAL DATA:  Mild confusion and memory loss. Sweating and high blood sugar. History of dementia. No focal neurological defect. EXAM: CT HEAD WITHOUT CONTRAST TECHNIQUE: Contiguous axial images were obtained from the base of the skull through the vertex without intravenous contrast. COMPARISON:  None. FINDINGS: Brain: Mild diffuse cerebral atrophy. Ventricular dilatation consistent with central atrophy. Patchy low-attenuation changes in the deep white matter consistent with small vessel ischemia. No mass-effect or midline shift. No abnormal extra-axial fluid collections. Gray-white matter junctions are distinct.  Basal cisterns are not effaced. No acute intracranial hemorrhage. Vascular: Intracranial arterial vascular calcifications are present. Skull: Calvarium appears intact. Sinuses/Orbits: Mucosal thickening in the paranasal sinuses. No acute air-fluid levels. Mastoid air cells are clear. Other: None. IMPRESSION: No acute intracranial abnormalities. Chronic atrophy and small vessel ischemic changes. Electronically Signed   By: Lucienne Capers M.D.   On: 09/12/2017 01:19   Ct Chest Wo Contrast  Result Date: 09/12/2017 CLINICAL DATA:  Mediastinal mass suspected  on chest radiograph. EXAM: CT CHEST WITHOUT CONTRAST TECHNIQUE: Multidetector CT imaging of the chest was performed following the standard protocol without IV contrast. COMPARISON:  Chest radiograph 09/12/2017 FINDINGS: Cardiovascular: Heart size is within normal limits. There is coronary artery calcification. There is atherosclerotic calcification of the thoracic aorta. There is a normal 3 vessel aortic branching pattern. There is a somewhat tortuous course of the brachiocephalic artery and proximal right subclavian artery. Mediastinum/Nodes: Mildly patulous thoracic esophagus. The thyroid is mildly enlarged with a 2.8 cm right thyroid lobe low-density nodule. There are irregular calcifications in the lower thyroid isthmus. No mediastinal lymphadenopathy. No axillary abnormality. Lungs/Pleura: Lungs are clear. No pleural effusion or pneumothorax. Upper Abdomen: There is excreted contrast in the renal collecting systems from the earlier contrast-enhanced CT of the abdomen and pelvis. Musculoskeletal: No chest wall mass or suspicious bone lesions identified. IMPRESSION: 1. The paramediastinal abnormality seen on the chest radiograph likely corresponds to composite shadow of an enlarged right thyroid lobe with 2.8 cm nodule and mildly tortuous brachiocephalic and proximal right subclavian arteries. 2. Non emergent dedicated thyroid ultrasound is recommended for further characterization of the right thyroid lobe nodule. 3.  Aortic Atherosclerosis (ICD10-I70.0). Electronically Signed   By: Ulyses Jarred M.D.   On: 09/12/2017 03:09   Mr Brain Wo Contrast  Result Date: 09/12/2017 CLINICAL DATA:  Transient ischemic attack. The eighth and speech difficulties. EXAM: MRI HEAD WITHOUT CONTRAST MRA HEAD WITHOUT CONTRAST TECHNIQUE: Multiplanar, multiecho pulse sequences of the brain and surrounding structures were obtained without intravenous contrast. Angiographic images of the head were obtained using MRA technique  without contrast. COMPARISON:  Head CT 09/12/2017 FINDINGS: MRI HEAD FINDINGS Brain: The midline structures are normal. No focal diffusion restriction to indicate acute infarct. No intraparenchymal hemorrhage. There is beginning confluent hyperintense T2-weighted signal within the periventricular and deep white matter, most often seen in the setting of chronic microvascular ischemia. No mass lesion. No chronic microhemorrhage or cerebral amyloid angiopathy. No hydrocephalus, age advanced atrophy or lobar predominant volume loss. No dural abnormality or extra-axial collection. Skull and upper cervical spine: The visualized skull base, calvarium, upper cervical spine and extracranial soft tissues are normal. Sinuses/Orbits: No fluid levels or advanced mucosal thickening. No mastoid effusion. Normal orbits. MRA HEAD FINDINGS Intracranial internal carotid arteries: Mild-to-moderate narrowing the supraclinoid internal carotid arteries, left-greater-than-right. Anterior cerebral arteries: Normal. Middle cerebral arteries: Normal. Posterior communicating arteries: Absent bilaterally. Posterior cerebral arteries: Normal. Basilar artery: Normal. Vertebral arteries: Left dominant. Normal. Superior cerebellar arteries: Normal. Anterior inferior cerebellar arteries: Normal. Posterior inferior cerebellar arteries: Normal. IMPRESSION: 1. No acute intracranial abnormality. 2. Mild findings of chronic ischemic microangiopathy. 3. No emergent large vessel occlusion. Mild-to-moderate supraclinoid ICA narrowing, left-greater-than-right, but otherwise normal intracranial MRA. Electronically Signed   By: Ulyses Jarred M.D.   On: 09/12/2017 05:28   Ct Abdomen Pelvis W Contrast  Result Date: 09/12/2017 CLINICAL DATA:  Confusion, diaphoresis and hyperglycemia. Left upper abdominal pain. EXAM: CT ABDOMEN AND PELVIS WITH CONTRAST TECHNIQUE: Multidetector CT imaging of the abdomen and  pelvis was performed using the standard protocol  following bolus administration of intravenous contrast. CONTRAST:  116mL ISOVUE-300 IOPAMIDOL (ISOVUE-300) INJECTION 61% COMPARISON:  None. FINDINGS: Lower chest: No pulmonary nodules or pleural effusion. No visible pericardial effusion. Hepatobiliary: Small right hepatic lobe cyst. No focal hepatic abnormality. No biliary dilatation. Normal gallbladder. Pancreas: Normal contours without ductal dilatation. No peripancreatic fluid collection. Spleen: Normal. Adrenals/Urinary Tract: --Adrenal glands: Normal. --Right kidney/ureter: Subcentimeter renal hypodensities are likely cysts. No solid mass. No nephrolithiasis. No hydronephrosis. --Left kidney/ureter: No hydronephrosis or perinephric stranding. No nephrolithiasis. No obstructing ureteral stones. --Urinary bladder: Unremarkable. Stomach/Bowel: --Stomach/Duodenum: Patulous distal esophagus. No hiatal hernia. Normal duodenal course. --Small bowel: No dilatation or inflammation. --Colon: No focal abnormality. --Appendix: Normal. Vascular/Lymphatic: Atherosclerotic calcification is present within the non-aneurysmal abdominal aorta, without hemodynamically significant stenosis. No abdominal or pelvic lymphadenopathy. Reproductive: Status post hysterectomy. No adnexal mass. Musculoskeletal. No bony spinal canal stenosis or focal osseous abnormality. Other: None. IMPRESSION: 1. No acute abdominopelvic abnormality. 2.  Aortic Atherosclerosis (ICD10-I70.0). Electronically Signed   By: Ulyses Jarred M.D.   On: 09/12/2017 01:30   Mr Jodene Nam Head Wo Contrast  Result Date: 09/12/2017 CLINICAL DATA:  Transient ischemic attack. The eighth and speech difficulties. EXAM: MRI HEAD WITHOUT CONTRAST MRA HEAD WITHOUT CONTRAST TECHNIQUE: Multiplanar, multiecho pulse sequences of the brain and surrounding structures were obtained without intravenous contrast. Angiographic images of the head were obtained using MRA technique without contrast. COMPARISON:  Head CT 09/12/2017 FINDINGS:  MRI HEAD FINDINGS Brain: The midline structures are normal. No focal diffusion restriction to indicate acute infarct. No intraparenchymal hemorrhage. There is beginning confluent hyperintense T2-weighted signal within the periventricular and deep white matter, most often seen in the setting of chronic microvascular ischemia. No mass lesion. No chronic microhemorrhage or cerebral amyloid angiopathy. No hydrocephalus, age advanced atrophy or lobar predominant volume loss. No dural abnormality or extra-axial collection. Skull and upper cervical spine: The visualized skull base, calvarium, upper cervical spine and extracranial soft tissues are normal. Sinuses/Orbits: No fluid levels or advanced mucosal thickening. No mastoid effusion. Normal orbits. MRA HEAD FINDINGS Intracranial internal carotid arteries: Mild-to-moderate narrowing the supraclinoid internal carotid arteries, left-greater-than-right. Anterior cerebral arteries: Normal. Middle cerebral arteries: Normal. Posterior communicating arteries: Absent bilaterally. Posterior cerebral arteries: Normal. Basilar artery: Normal. Vertebral arteries: Left dominant. Normal. Superior cerebellar arteries: Normal. Anterior inferior cerebellar arteries: Normal. Posterior inferior cerebellar arteries: Normal. IMPRESSION: 1. No acute intracranial abnormality. 2. Mild findings of chronic ischemic microangiopathy. 3. No emergent large vessel occlusion. Mild-to-moderate supraclinoid ICA narrowing, left-greater-than-right, but otherwise normal intracranial MRA. Electronically Signed   By: Ulyses Jarred M.D.   On: 09/12/2017 05:28   Labs: Basic Metabolic Panel: Recent Labs  Lab 09/11/17 2305 09/13/17 0639  NA 133*  --   K 4.5  --   CL 102  --   CO2 24  --   GLUCOSE 259*  --   BUN 15  --   CREATININE 0.91  --   CALCIUM 9.0  --   MG  --  1.7   Liver Function Tests: Recent Labs  Lab 09/11/17 2305  AST 25  ALT 19  ALKPHOS 76  BILITOT 1.5*  PROT 7.1  ALBUMIN  3.9   Recent Labs  Lab 09/11/17 2305  LIPASE 23   CBC: Recent Labs  Lab 09/11/17 2305  WBC 11.8*  NEUTROABS 10.7*  HGB 14.0  HCT 42.0  MCV 91.1  PLT 209   Cardiac Enzymes: Recent Labs  Lab 09/12/17 2314 09/13/17 1610  TROPONINI <0.03 <0.03    CBG: Recent Labs  Lab 09/12/17 0614 09/12/17 1125 09/12/17 1624 09/12/17 2121 09/13/17 0622  GLUCAP 140* 110* 158* 117* 144*     Signed:  Barton Dubois MD.  [facility] 09/13/2017, 8:49 AM

## 2017-09-13 NOTE — Care Management Note (Addendum)
Case Management Note Previous Note Created by Bronson Curb  Patient Details  Name: [name] MRN: [id number] Date of Birth: 1924/04/14  Subjective/Objective:                    Action/Plan: PT recommending [place] services. CM spoke to the patients son and daughter in law and they asked to use [facility] for [facility]. Brad with [facility] informed. MD notified for need of [place] orders. Pt with orders for rolling walker. Brad with [facility] notified and will deliver the equipment to the room.   Expected Discharge Date:  09/13/17               Expected Discharge Plan:  [place]  In-House Referral:     Discharge planning Services  CM Consult  Post Acute Care Choice:  Home Health Choice offered to:     DME Arranged:    DME Agency:     HH Arranged:    [place] Agency:  [place]  Status of Service:  In process, will continue to follow  If discussed at Long Length of Stay Meetings, dates discussed:    Additional Comments: 09/13/2017 Pt to discharge home today - HH and DME orders written- [facility] made aware of discharge today.  Pt will transport home via private vehicle driven by son.  Pts son and daughter in law will be with pt at discharge.     Maryclare Labrador, RN 09/13/2017, 10:46 AM

## 2017-09-16 DIAGNOSIS — H34811 Central retinal vein occlusion, right eye, with macular edema: Secondary | ICD-10-CM | POA: Diagnosis not present

## 2017-09-16 DIAGNOSIS — Z961 Presence of intraocular lens: Secondary | ICD-10-CM | POA: Diagnosis not present

## 2017-09-16 DIAGNOSIS — H401133 Primary open-angle glaucoma, bilateral, severe stage: Secondary | ICD-10-CM | POA: Diagnosis not present

## 2017-09-16 DIAGNOSIS — H04123 Dry eye syndrome of bilateral lacrimal glands: Secondary | ICD-10-CM | POA: Diagnosis not present

## 2017-09-18 DIAGNOSIS — Z09 Encounter for follow-up examination after completed treatment for conditions other than malignant neoplasm: Secondary | ICD-10-CM | POA: Diagnosis not present

## 2017-09-18 DIAGNOSIS — E1165 Type 2 diabetes mellitus with hyperglycemia: Secondary | ICD-10-CM | POA: Diagnosis not present

## 2017-09-19 DIAGNOSIS — E1165 Type 2 diabetes mellitus with hyperglycemia: Secondary | ICD-10-CM | POA: Diagnosis not present

## 2017-09-19 DIAGNOSIS — E871 Hypo-osmolality and hyponatremia: Secondary | ICD-10-CM | POA: Diagnosis not present

## 2017-09-19 DIAGNOSIS — D497 Neoplasm of unspecified behavior of endocrine glands and other parts of nervous system: Secondary | ICD-10-CM | POA: Diagnosis not present

## 2017-09-19 DIAGNOSIS — I10 Essential (primary) hypertension: Secondary | ICD-10-CM | POA: Diagnosis not present

## 2017-09-19 DIAGNOSIS — Z8673 Personal history of transient ischemic attack (TIA), and cerebral infarction without residual deficits: Secondary | ICD-10-CM | POA: Diagnosis not present

## 2017-09-19 DIAGNOSIS — R4789 Other speech disturbances: Secondary | ICD-10-CM | POA: Diagnosis not present

## 2017-09-19 DIAGNOSIS — Z7984 Long term (current) use of oral hypoglycemic drugs: Secondary | ICD-10-CM | POA: Diagnosis not present

## 2017-09-19 DIAGNOSIS — H409 Unspecified glaucoma: Secondary | ICD-10-CM | POA: Diagnosis not present

## 2017-09-22 DIAGNOSIS — E1165 Type 2 diabetes mellitus with hyperglycemia: Secondary | ICD-10-CM | POA: Diagnosis not present

## 2017-09-22 DIAGNOSIS — R4789 Other speech disturbances: Secondary | ICD-10-CM | POA: Diagnosis not present

## 2017-09-22 DIAGNOSIS — I10 Essential (primary) hypertension: Secondary | ICD-10-CM | POA: Diagnosis not present

## 2017-09-22 DIAGNOSIS — H409 Unspecified glaucoma: Secondary | ICD-10-CM | POA: Diagnosis not present

## 2017-09-22 DIAGNOSIS — Z8673 Personal history of transient ischemic attack (TIA), and cerebral infarction without residual deficits: Secondary | ICD-10-CM | POA: Diagnosis not present

## 2017-09-22 DIAGNOSIS — E871 Hypo-osmolality and hyponatremia: Secondary | ICD-10-CM | POA: Diagnosis not present

## 2017-09-22 DIAGNOSIS — Z7984 Long term (current) use of oral hypoglycemic drugs: Secondary | ICD-10-CM | POA: Diagnosis not present

## 2017-09-22 DIAGNOSIS — D497 Neoplasm of unspecified behavior of endocrine glands and other parts of nervous system: Secondary | ICD-10-CM | POA: Diagnosis not present

## 2017-09-24 DIAGNOSIS — R4789 Other speech disturbances: Secondary | ICD-10-CM | POA: Diagnosis not present

## 2017-09-24 DIAGNOSIS — Z8673 Personal history of transient ischemic attack (TIA), and cerebral infarction without residual deficits: Secondary | ICD-10-CM | POA: Diagnosis not present

## 2017-09-24 DIAGNOSIS — I10 Essential (primary) hypertension: Secondary | ICD-10-CM | POA: Diagnosis not present

## 2017-09-24 DIAGNOSIS — Z7984 Long term (current) use of oral hypoglycemic drugs: Secondary | ICD-10-CM | POA: Diagnosis not present

## 2017-09-24 DIAGNOSIS — E871 Hypo-osmolality and hyponatremia: Secondary | ICD-10-CM | POA: Diagnosis not present

## 2017-09-24 DIAGNOSIS — E1165 Type 2 diabetes mellitus with hyperglycemia: Secondary | ICD-10-CM | POA: Diagnosis not present

## 2017-09-24 DIAGNOSIS — D497 Neoplasm of unspecified behavior of endocrine glands and other parts of nervous system: Secondary | ICD-10-CM | POA: Diagnosis not present

## 2017-09-24 DIAGNOSIS — H409 Unspecified glaucoma: Secondary | ICD-10-CM | POA: Diagnosis not present

## 2017-09-25 DIAGNOSIS — E041 Nontoxic single thyroid nodule: Secondary | ICD-10-CM | POA: Diagnosis not present

## 2017-09-25 DIAGNOSIS — E1165 Type 2 diabetes mellitus with hyperglycemia: Secondary | ICD-10-CM | POA: Diagnosis not present

## 2017-09-25 DIAGNOSIS — Z23 Encounter for immunization: Secondary | ICD-10-CM | POA: Diagnosis not present

## 2017-09-25 DIAGNOSIS — I10 Essential (primary) hypertension: Secondary | ICD-10-CM | POA: Diagnosis not present

## 2017-09-26 DIAGNOSIS — I10 Essential (primary) hypertension: Secondary | ICD-10-CM | POA: Diagnosis not present

## 2017-09-26 DIAGNOSIS — E1165 Type 2 diabetes mellitus with hyperglycemia: Secondary | ICD-10-CM | POA: Diagnosis not present

## 2017-09-26 DIAGNOSIS — R4789 Other speech disturbances: Secondary | ICD-10-CM | POA: Diagnosis not present

## 2017-09-26 DIAGNOSIS — H409 Unspecified glaucoma: Secondary | ICD-10-CM | POA: Diagnosis not present

## 2017-09-26 DIAGNOSIS — Z8673 Personal history of transient ischemic attack (TIA), and cerebral infarction without residual deficits: Secondary | ICD-10-CM | POA: Diagnosis not present

## 2017-09-26 DIAGNOSIS — Z7984 Long term (current) use of oral hypoglycemic drugs: Secondary | ICD-10-CM | POA: Diagnosis not present

## 2017-09-26 DIAGNOSIS — E871 Hypo-osmolality and hyponatremia: Secondary | ICD-10-CM | POA: Diagnosis not present

## 2017-09-26 DIAGNOSIS — D497 Neoplasm of unspecified behavior of endocrine glands and other parts of nervous system: Secondary | ICD-10-CM | POA: Diagnosis not present

## 2017-09-30 ENCOUNTER — Other Ambulatory Visit: Payer: Self-pay | Admitting: Internal Medicine

## 2017-09-30 DIAGNOSIS — E041 Nontoxic single thyroid nodule: Secondary | ICD-10-CM

## 2017-10-01 DIAGNOSIS — Z8673 Personal history of transient ischemic attack (TIA), and cerebral infarction without residual deficits: Secondary | ICD-10-CM | POA: Diagnosis not present

## 2017-10-01 DIAGNOSIS — H409 Unspecified glaucoma: Secondary | ICD-10-CM | POA: Diagnosis not present

## 2017-10-01 DIAGNOSIS — E871 Hypo-osmolality and hyponatremia: Secondary | ICD-10-CM | POA: Diagnosis not present

## 2017-10-01 DIAGNOSIS — E1165 Type 2 diabetes mellitus with hyperglycemia: Secondary | ICD-10-CM | POA: Diagnosis not present

## 2017-10-01 DIAGNOSIS — D497 Neoplasm of unspecified behavior of endocrine glands and other parts of nervous system: Secondary | ICD-10-CM | POA: Diagnosis not present

## 2017-10-01 DIAGNOSIS — Z7984 Long term (current) use of oral hypoglycemic drugs: Secondary | ICD-10-CM | POA: Diagnosis not present

## 2017-10-01 DIAGNOSIS — R4789 Other speech disturbances: Secondary | ICD-10-CM | POA: Diagnosis not present

## 2017-10-01 DIAGNOSIS — I10 Essential (primary) hypertension: Secondary | ICD-10-CM | POA: Diagnosis not present

## 2017-10-02 DIAGNOSIS — H409 Unspecified glaucoma: Secondary | ICD-10-CM | POA: Diagnosis not present

## 2017-10-02 DIAGNOSIS — E871 Hypo-osmolality and hyponatremia: Secondary | ICD-10-CM | POA: Diagnosis not present

## 2017-10-02 DIAGNOSIS — Z7984 Long term (current) use of oral hypoglycemic drugs: Secondary | ICD-10-CM | POA: Diagnosis not present

## 2017-10-02 DIAGNOSIS — Z8673 Personal history of transient ischemic attack (TIA), and cerebral infarction without residual deficits: Secondary | ICD-10-CM | POA: Diagnosis not present

## 2017-10-02 DIAGNOSIS — E1165 Type 2 diabetes mellitus with hyperglycemia: Secondary | ICD-10-CM | POA: Diagnosis not present

## 2017-10-02 DIAGNOSIS — R4789 Other speech disturbances: Secondary | ICD-10-CM | POA: Diagnosis not present

## 2017-10-02 DIAGNOSIS — D497 Neoplasm of unspecified behavior of endocrine glands and other parts of nervous system: Secondary | ICD-10-CM | POA: Diagnosis not present

## 2017-10-02 DIAGNOSIS — I10 Essential (primary) hypertension: Secondary | ICD-10-CM | POA: Diagnosis not present

## 2017-10-03 DIAGNOSIS — Z7984 Long term (current) use of oral hypoglycemic drugs: Secondary | ICD-10-CM | POA: Diagnosis not present

## 2017-10-03 DIAGNOSIS — D497 Neoplasm of unspecified behavior of endocrine glands and other parts of nervous system: Secondary | ICD-10-CM | POA: Diagnosis not present

## 2017-10-03 DIAGNOSIS — H409 Unspecified glaucoma: Secondary | ICD-10-CM | POA: Diagnosis not present

## 2017-10-03 DIAGNOSIS — Z8673 Personal history of transient ischemic attack (TIA), and cerebral infarction without residual deficits: Secondary | ICD-10-CM | POA: Diagnosis not present

## 2017-10-03 DIAGNOSIS — E871 Hypo-osmolality and hyponatremia: Secondary | ICD-10-CM | POA: Diagnosis not present

## 2017-10-03 DIAGNOSIS — I10 Essential (primary) hypertension: Secondary | ICD-10-CM | POA: Diagnosis not present

## 2017-10-03 DIAGNOSIS — R4789 Other speech disturbances: Secondary | ICD-10-CM | POA: Diagnosis not present

## 2017-10-03 DIAGNOSIS — E1165 Type 2 diabetes mellitus with hyperglycemia: Secondary | ICD-10-CM | POA: Diagnosis not present

## 2017-10-09 DIAGNOSIS — Z8673 Personal history of transient ischemic attack (TIA), and cerebral infarction without residual deficits: Secondary | ICD-10-CM | POA: Diagnosis not present

## 2017-10-09 DIAGNOSIS — R4789 Other speech disturbances: Secondary | ICD-10-CM | POA: Diagnosis not present

## 2017-10-09 DIAGNOSIS — D497 Neoplasm of unspecified behavior of endocrine glands and other parts of nervous system: Secondary | ICD-10-CM | POA: Diagnosis not present

## 2017-10-09 DIAGNOSIS — Z7984 Long term (current) use of oral hypoglycemic drugs: Secondary | ICD-10-CM | POA: Diagnosis not present

## 2017-10-09 DIAGNOSIS — H409 Unspecified glaucoma: Secondary | ICD-10-CM | POA: Diagnosis not present

## 2017-10-09 DIAGNOSIS — E871 Hypo-osmolality and hyponatremia: Secondary | ICD-10-CM | POA: Diagnosis not present

## 2017-10-09 DIAGNOSIS — E1165 Type 2 diabetes mellitus with hyperglycemia: Secondary | ICD-10-CM | POA: Diagnosis not present

## 2017-10-09 DIAGNOSIS — I10 Essential (primary) hypertension: Secondary | ICD-10-CM | POA: Diagnosis not present

## 2017-10-17 DIAGNOSIS — Z8673 Personal history of transient ischemic attack (TIA), and cerebral infarction without residual deficits: Secondary | ICD-10-CM | POA: Diagnosis not present

## 2017-10-17 DIAGNOSIS — H409 Unspecified glaucoma: Secondary | ICD-10-CM | POA: Diagnosis not present

## 2017-10-17 DIAGNOSIS — D497 Neoplasm of unspecified behavior of endocrine glands and other parts of nervous system: Secondary | ICD-10-CM | POA: Diagnosis not present

## 2017-10-17 DIAGNOSIS — E871 Hypo-osmolality and hyponatremia: Secondary | ICD-10-CM | POA: Diagnosis not present

## 2017-10-17 DIAGNOSIS — R4789 Other speech disturbances: Secondary | ICD-10-CM | POA: Diagnosis not present

## 2017-10-17 DIAGNOSIS — I10 Essential (primary) hypertension: Secondary | ICD-10-CM | POA: Diagnosis not present

## 2017-10-17 DIAGNOSIS — Z7984 Long term (current) use of oral hypoglycemic drugs: Secondary | ICD-10-CM | POA: Diagnosis not present

## 2017-10-17 DIAGNOSIS — E1165 Type 2 diabetes mellitus with hyperglycemia: Secondary | ICD-10-CM | POA: Diagnosis not present

## 2017-10-22 DIAGNOSIS — I10 Essential (primary) hypertension: Secondary | ICD-10-CM | POA: Diagnosis not present

## 2017-10-22 DIAGNOSIS — R4789 Other speech disturbances: Secondary | ICD-10-CM | POA: Diagnosis not present

## 2017-10-22 DIAGNOSIS — H409 Unspecified glaucoma: Secondary | ICD-10-CM | POA: Diagnosis not present

## 2017-10-22 DIAGNOSIS — Z8673 Personal history of transient ischemic attack (TIA), and cerebral infarction without residual deficits: Secondary | ICD-10-CM | POA: Diagnosis not present

## 2017-10-22 DIAGNOSIS — Z7984 Long term (current) use of oral hypoglycemic drugs: Secondary | ICD-10-CM | POA: Diagnosis not present

## 2017-10-22 DIAGNOSIS — E871 Hypo-osmolality and hyponatremia: Secondary | ICD-10-CM | POA: Diagnosis not present

## 2017-10-22 DIAGNOSIS — D497 Neoplasm of unspecified behavior of endocrine glands and other parts of nervous system: Secondary | ICD-10-CM | POA: Diagnosis not present

## 2017-10-22 DIAGNOSIS — E1165 Type 2 diabetes mellitus with hyperglycemia: Secondary | ICD-10-CM | POA: Diagnosis not present

## 2017-10-24 DIAGNOSIS — E1165 Type 2 diabetes mellitus with hyperglycemia: Secondary | ICD-10-CM | POA: Diagnosis not present

## 2017-10-24 DIAGNOSIS — H409 Unspecified glaucoma: Secondary | ICD-10-CM | POA: Diagnosis not present

## 2017-10-24 DIAGNOSIS — I10 Essential (primary) hypertension: Secondary | ICD-10-CM | POA: Diagnosis not present

## 2017-10-24 DIAGNOSIS — D497 Neoplasm of unspecified behavior of endocrine glands and other parts of nervous system: Secondary | ICD-10-CM | POA: Diagnosis not present

## 2017-10-24 DIAGNOSIS — Z8673 Personal history of transient ischemic attack (TIA), and cerebral infarction without residual deficits: Secondary | ICD-10-CM | POA: Diagnosis not present

## 2017-10-24 DIAGNOSIS — E871 Hypo-osmolality and hyponatremia: Secondary | ICD-10-CM | POA: Diagnosis not present

## 2017-10-24 DIAGNOSIS — R4789 Other speech disturbances: Secondary | ICD-10-CM | POA: Diagnosis not present

## 2017-10-24 DIAGNOSIS — Z7984 Long term (current) use of oral hypoglycemic drugs: Secondary | ICD-10-CM | POA: Diagnosis not present

## 2017-10-27 DIAGNOSIS — E871 Hypo-osmolality and hyponatremia: Secondary | ICD-10-CM | POA: Diagnosis not present

## 2017-10-27 DIAGNOSIS — H409 Unspecified glaucoma: Secondary | ICD-10-CM | POA: Diagnosis not present

## 2017-10-27 DIAGNOSIS — I10 Essential (primary) hypertension: Secondary | ICD-10-CM | POA: Diagnosis not present

## 2017-10-27 DIAGNOSIS — E1165 Type 2 diabetes mellitus with hyperglycemia: Secondary | ICD-10-CM | POA: Diagnosis not present

## 2017-10-27 DIAGNOSIS — Z8673 Personal history of transient ischemic attack (TIA), and cerebral infarction without residual deficits: Secondary | ICD-10-CM | POA: Diagnosis not present

## 2017-10-27 DIAGNOSIS — D497 Neoplasm of unspecified behavior of endocrine glands and other parts of nervous system: Secondary | ICD-10-CM | POA: Diagnosis not present

## 2017-10-27 DIAGNOSIS — R4789 Other speech disturbances: Secondary | ICD-10-CM | POA: Diagnosis not present

## 2017-10-27 DIAGNOSIS — Z7984 Long term (current) use of oral hypoglycemic drugs: Secondary | ICD-10-CM | POA: Diagnosis not present

## 2017-10-29 DIAGNOSIS — I10 Essential (primary) hypertension: Secondary | ICD-10-CM | POA: Diagnosis not present

## 2017-10-29 DIAGNOSIS — E871 Hypo-osmolality and hyponatremia: Secondary | ICD-10-CM | POA: Diagnosis not present

## 2017-10-29 DIAGNOSIS — Z7984 Long term (current) use of oral hypoglycemic drugs: Secondary | ICD-10-CM | POA: Diagnosis not present

## 2017-10-29 DIAGNOSIS — Z8673 Personal history of transient ischemic attack (TIA), and cerebral infarction without residual deficits: Secondary | ICD-10-CM | POA: Diagnosis not present

## 2017-10-29 DIAGNOSIS — H409 Unspecified glaucoma: Secondary | ICD-10-CM | POA: Diagnosis not present

## 2017-10-29 DIAGNOSIS — R4789 Other speech disturbances: Secondary | ICD-10-CM | POA: Diagnosis not present

## 2017-10-29 DIAGNOSIS — E1165 Type 2 diabetes mellitus with hyperglycemia: Secondary | ICD-10-CM | POA: Diagnosis not present

## 2017-10-29 DIAGNOSIS — D497 Neoplasm of unspecified behavior of endocrine glands and other parts of nervous system: Secondary | ICD-10-CM | POA: Diagnosis not present

## 2017-10-30 DIAGNOSIS — R4789 Other speech disturbances: Secondary | ICD-10-CM | POA: Diagnosis not present

## 2017-10-30 DIAGNOSIS — D497 Neoplasm of unspecified behavior of endocrine glands and other parts of nervous system: Secondary | ICD-10-CM | POA: Diagnosis not present

## 2017-10-30 DIAGNOSIS — E871 Hypo-osmolality and hyponatremia: Secondary | ICD-10-CM | POA: Diagnosis not present

## 2017-10-30 DIAGNOSIS — H409 Unspecified glaucoma: Secondary | ICD-10-CM | POA: Diagnosis not present

## 2017-10-30 DIAGNOSIS — Z8673 Personal history of transient ischemic attack (TIA), and cerebral infarction without residual deficits: Secondary | ICD-10-CM | POA: Diagnosis not present

## 2017-10-30 DIAGNOSIS — E1165 Type 2 diabetes mellitus with hyperglycemia: Secondary | ICD-10-CM | POA: Diagnosis not present

## 2017-10-30 DIAGNOSIS — I10 Essential (primary) hypertension: Secondary | ICD-10-CM | POA: Diagnosis not present

## 2017-10-30 DIAGNOSIS — Z7984 Long term (current) use of oral hypoglycemic drugs: Secondary | ICD-10-CM | POA: Diagnosis not present

## 2017-10-31 DIAGNOSIS — I10 Essential (primary) hypertension: Secondary | ICD-10-CM | POA: Diagnosis not present

## 2017-10-31 DIAGNOSIS — R4789 Other speech disturbances: Secondary | ICD-10-CM | POA: Diagnosis not present

## 2017-10-31 DIAGNOSIS — H409 Unspecified glaucoma: Secondary | ICD-10-CM | POA: Diagnosis not present

## 2017-10-31 DIAGNOSIS — E1165 Type 2 diabetes mellitus with hyperglycemia: Secondary | ICD-10-CM | POA: Diagnosis not present

## 2017-10-31 DIAGNOSIS — Z8673 Personal history of transient ischemic attack (TIA), and cerebral infarction without residual deficits: Secondary | ICD-10-CM | POA: Diagnosis not present

## 2017-10-31 DIAGNOSIS — Z7984 Long term (current) use of oral hypoglycemic drugs: Secondary | ICD-10-CM | POA: Diagnosis not present

## 2017-10-31 DIAGNOSIS — E871 Hypo-osmolality and hyponatremia: Secondary | ICD-10-CM | POA: Diagnosis not present

## 2017-10-31 DIAGNOSIS — D497 Neoplasm of unspecified behavior of endocrine glands and other parts of nervous system: Secondary | ICD-10-CM | POA: Diagnosis not present

## 2017-11-04 DIAGNOSIS — I10 Essential (primary) hypertension: Secondary | ICD-10-CM | POA: Diagnosis not present

## 2017-11-04 DIAGNOSIS — Z8673 Personal history of transient ischemic attack (TIA), and cerebral infarction without residual deficits: Secondary | ICD-10-CM | POA: Diagnosis not present

## 2017-11-04 DIAGNOSIS — R4789 Other speech disturbances: Secondary | ICD-10-CM | POA: Diagnosis not present

## 2017-11-04 DIAGNOSIS — Z7984 Long term (current) use of oral hypoglycemic drugs: Secondary | ICD-10-CM | POA: Diagnosis not present

## 2017-11-04 DIAGNOSIS — E871 Hypo-osmolality and hyponatremia: Secondary | ICD-10-CM | POA: Diagnosis not present

## 2017-11-04 DIAGNOSIS — H409 Unspecified glaucoma: Secondary | ICD-10-CM | POA: Diagnosis not present

## 2017-11-04 DIAGNOSIS — E1165 Type 2 diabetes mellitus with hyperglycemia: Secondary | ICD-10-CM | POA: Diagnosis not present

## 2017-11-04 DIAGNOSIS — D497 Neoplasm of unspecified behavior of endocrine glands and other parts of nervous system: Secondary | ICD-10-CM | POA: Diagnosis not present

## 2017-11-06 DIAGNOSIS — D497 Neoplasm of unspecified behavior of endocrine glands and other parts of nervous system: Secondary | ICD-10-CM | POA: Diagnosis not present

## 2017-11-06 DIAGNOSIS — R4789 Other speech disturbances: Secondary | ICD-10-CM | POA: Diagnosis not present

## 2017-11-06 DIAGNOSIS — H409 Unspecified glaucoma: Secondary | ICD-10-CM | POA: Diagnosis not present

## 2017-11-06 DIAGNOSIS — Z8673 Personal history of transient ischemic attack (TIA), and cerebral infarction without residual deficits: Secondary | ICD-10-CM | POA: Diagnosis not present

## 2017-11-06 DIAGNOSIS — I10 Essential (primary) hypertension: Secondary | ICD-10-CM | POA: Diagnosis not present

## 2017-11-06 DIAGNOSIS — E1165 Type 2 diabetes mellitus with hyperglycemia: Secondary | ICD-10-CM | POA: Diagnosis not present

## 2017-11-06 DIAGNOSIS — E871 Hypo-osmolality and hyponatremia: Secondary | ICD-10-CM | POA: Diagnosis not present

## 2017-11-06 DIAGNOSIS — Z7984 Long term (current) use of oral hypoglycemic drugs: Secondary | ICD-10-CM | POA: Diagnosis not present

## 2017-11-07 DIAGNOSIS — E871 Hypo-osmolality and hyponatremia: Secondary | ICD-10-CM | POA: Diagnosis not present

## 2017-11-07 DIAGNOSIS — R4789 Other speech disturbances: Secondary | ICD-10-CM | POA: Diagnosis not present

## 2017-11-07 DIAGNOSIS — E1165 Type 2 diabetes mellitus with hyperglycemia: Secondary | ICD-10-CM | POA: Diagnosis not present

## 2017-11-07 DIAGNOSIS — Z8673 Personal history of transient ischemic attack (TIA), and cerebral infarction without residual deficits: Secondary | ICD-10-CM | POA: Diagnosis not present

## 2017-11-07 DIAGNOSIS — I10 Essential (primary) hypertension: Secondary | ICD-10-CM | POA: Diagnosis not present

## 2017-11-07 DIAGNOSIS — D497 Neoplasm of unspecified behavior of endocrine glands and other parts of nervous system: Secondary | ICD-10-CM | POA: Diagnosis not present

## 2017-11-07 DIAGNOSIS — H409 Unspecified glaucoma: Secondary | ICD-10-CM | POA: Diagnosis not present

## 2017-11-07 DIAGNOSIS — Z7984 Long term (current) use of oral hypoglycemic drugs: Secondary | ICD-10-CM | POA: Diagnosis not present

## 2017-11-11 DIAGNOSIS — E1165 Type 2 diabetes mellitus with hyperglycemia: Secondary | ICD-10-CM | POA: Diagnosis not present

## 2017-11-11 DIAGNOSIS — I10 Essential (primary) hypertension: Secondary | ICD-10-CM | POA: Diagnosis not present

## 2017-11-11 DIAGNOSIS — Z7984 Long term (current) use of oral hypoglycemic drugs: Secondary | ICD-10-CM | POA: Diagnosis not present

## 2017-11-11 DIAGNOSIS — D497 Neoplasm of unspecified behavior of endocrine glands and other parts of nervous system: Secondary | ICD-10-CM | POA: Diagnosis not present

## 2017-11-11 DIAGNOSIS — E871 Hypo-osmolality and hyponatremia: Secondary | ICD-10-CM | POA: Diagnosis not present

## 2017-11-11 DIAGNOSIS — Z8673 Personal history of transient ischemic attack (TIA), and cerebral infarction without residual deficits: Secondary | ICD-10-CM | POA: Diagnosis not present

## 2017-11-11 DIAGNOSIS — H409 Unspecified glaucoma: Secondary | ICD-10-CM | POA: Diagnosis not present

## 2017-11-11 DIAGNOSIS — R4789 Other speech disturbances: Secondary | ICD-10-CM | POA: Diagnosis not present

## 2017-11-13 DIAGNOSIS — E871 Hypo-osmolality and hyponatremia: Secondary | ICD-10-CM | POA: Diagnosis not present

## 2017-11-13 DIAGNOSIS — H409 Unspecified glaucoma: Secondary | ICD-10-CM | POA: Diagnosis not present

## 2017-11-13 DIAGNOSIS — Z7984 Long term (current) use of oral hypoglycemic drugs: Secondary | ICD-10-CM | POA: Diagnosis not present

## 2017-11-13 DIAGNOSIS — Z8673 Personal history of transient ischemic attack (TIA), and cerebral infarction without residual deficits: Secondary | ICD-10-CM | POA: Diagnosis not present

## 2017-11-13 DIAGNOSIS — I10 Essential (primary) hypertension: Secondary | ICD-10-CM | POA: Diagnosis not present

## 2017-11-13 DIAGNOSIS — D497 Neoplasm of unspecified behavior of endocrine glands and other parts of nervous system: Secondary | ICD-10-CM | POA: Diagnosis not present

## 2017-11-13 DIAGNOSIS — R4789 Other speech disturbances: Secondary | ICD-10-CM | POA: Diagnosis not present

## 2017-11-13 DIAGNOSIS — E1165 Type 2 diabetes mellitus with hyperglycemia: Secondary | ICD-10-CM | POA: Diagnosis not present

## 2017-12-29 DIAGNOSIS — E1165 Type 2 diabetes mellitus with hyperglycemia: Secondary | ICD-10-CM | POA: Diagnosis not present

## 2017-12-29 DIAGNOSIS — R5383 Other fatigue: Secondary | ICD-10-CM | POA: Diagnosis not present

## 2017-12-29 DIAGNOSIS — M2550 Pain in unspecified joint: Secondary | ICD-10-CM | POA: Diagnosis not present

## 2018-03-30 DIAGNOSIS — H6120 Impacted cerumen, unspecified ear: Secondary | ICD-10-CM | POA: Diagnosis not present

## 2018-03-30 DIAGNOSIS — M150 Primary generalized (osteo)arthritis: Secondary | ICD-10-CM | POA: Diagnosis not present

## 2018-03-30 DIAGNOSIS — K5900 Constipation, unspecified: Secondary | ICD-10-CM | POA: Diagnosis not present

## 2018-04-10 DIAGNOSIS — H401133 Primary open-angle glaucoma, bilateral, severe stage: Secondary | ICD-10-CM | POA: Diagnosis not present

## 2018-04-10 DIAGNOSIS — H02052 Trichiasis without entropian right lower eyelid: Secondary | ICD-10-CM | POA: Diagnosis not present

## 2018-04-13 DIAGNOSIS — M25562 Pain in left knee: Secondary | ICD-10-CM | POA: Diagnosis not present

## 2018-04-13 DIAGNOSIS — M25561 Pain in right knee: Secondary | ICD-10-CM | POA: Diagnosis not present

## 2018-07-01 DIAGNOSIS — I10 Essential (primary) hypertension: Secondary | ICD-10-CM | POA: Diagnosis not present

## 2018-07-01 DIAGNOSIS — Z23 Encounter for immunization: Secondary | ICD-10-CM | POA: Diagnosis not present

## 2018-07-01 DIAGNOSIS — Z0000 Encounter for general adult medical examination without abnormal findings: Secondary | ICD-10-CM | POA: Diagnosis not present

## 2018-07-01 DIAGNOSIS — E1165 Type 2 diabetes mellitus with hyperglycemia: Secondary | ICD-10-CM | POA: Diagnosis not present

## 2018-07-10 DIAGNOSIS — H401133 Primary open-angle glaucoma, bilateral, severe stage: Secondary | ICD-10-CM | POA: Diagnosis not present

## 2018-07-21 DIAGNOSIS — R296 Repeated falls: Secondary | ICD-10-CM | POA: Diagnosis not present

## 2018-07-21 DIAGNOSIS — I700 Atherosclerosis of aorta: Secondary | ICD-10-CM | POA: Diagnosis not present

## 2018-07-21 DIAGNOSIS — Z7984 Long term (current) use of oral hypoglycemic drugs: Secondary | ICD-10-CM | POA: Diagnosis not present

## 2018-07-21 DIAGNOSIS — Z7982 Long term (current) use of aspirin: Secondary | ICD-10-CM | POA: Diagnosis not present

## 2018-07-21 DIAGNOSIS — I10 Essential (primary) hypertension: Secondary | ICD-10-CM | POA: Diagnosis not present

## 2018-07-21 DIAGNOSIS — M150 Primary generalized (osteo)arthritis: Secondary | ICD-10-CM | POA: Diagnosis not present

## 2018-07-21 DIAGNOSIS — R2681 Unsteadiness on feet: Secondary | ICD-10-CM | POA: Diagnosis not present

## 2018-07-21 DIAGNOSIS — E1165 Type 2 diabetes mellitus with hyperglycemia: Secondary | ICD-10-CM | POA: Diagnosis not present

## 2018-07-24 DIAGNOSIS — Z7984 Long term (current) use of oral hypoglycemic drugs: Secondary | ICD-10-CM | POA: Diagnosis not present

## 2018-07-24 DIAGNOSIS — I10 Essential (primary) hypertension: Secondary | ICD-10-CM | POA: Diagnosis not present

## 2018-07-24 DIAGNOSIS — M150 Primary generalized (osteo)arthritis: Secondary | ICD-10-CM | POA: Diagnosis not present

## 2018-07-24 DIAGNOSIS — I700 Atherosclerosis of aorta: Secondary | ICD-10-CM | POA: Diagnosis not present

## 2018-07-24 DIAGNOSIS — R296 Repeated falls: Secondary | ICD-10-CM | POA: Diagnosis not present

## 2018-07-24 DIAGNOSIS — E1165 Type 2 diabetes mellitus with hyperglycemia: Secondary | ICD-10-CM | POA: Diagnosis not present

## 2018-07-24 DIAGNOSIS — R2681 Unsteadiness on feet: Secondary | ICD-10-CM | POA: Diagnosis not present

## 2018-07-24 DIAGNOSIS — Z7982 Long term (current) use of aspirin: Secondary | ICD-10-CM | POA: Diagnosis not present

## 2018-07-29 DIAGNOSIS — R2681 Unsteadiness on feet: Secondary | ICD-10-CM | POA: Diagnosis not present

## 2018-07-29 DIAGNOSIS — E1165 Type 2 diabetes mellitus with hyperglycemia: Secondary | ICD-10-CM | POA: Diagnosis not present

## 2018-07-29 DIAGNOSIS — R296 Repeated falls: Secondary | ICD-10-CM | POA: Diagnosis not present

## 2018-07-29 DIAGNOSIS — Z7984 Long term (current) use of oral hypoglycemic drugs: Secondary | ICD-10-CM | POA: Diagnosis not present

## 2018-07-29 DIAGNOSIS — I10 Essential (primary) hypertension: Secondary | ICD-10-CM | POA: Diagnosis not present

## 2018-07-29 DIAGNOSIS — Z7982 Long term (current) use of aspirin: Secondary | ICD-10-CM | POA: Diagnosis not present

## 2018-07-29 DIAGNOSIS — I700 Atherosclerosis of aorta: Secondary | ICD-10-CM | POA: Diagnosis not present

## 2018-07-29 DIAGNOSIS — M150 Primary generalized (osteo)arthritis: Secondary | ICD-10-CM | POA: Diagnosis not present

## 2018-07-31 DIAGNOSIS — Z7982 Long term (current) use of aspirin: Secondary | ICD-10-CM | POA: Diagnosis not present

## 2018-07-31 DIAGNOSIS — M150 Primary generalized (osteo)arthritis: Secondary | ICD-10-CM | POA: Diagnosis not present

## 2018-07-31 DIAGNOSIS — I700 Atherosclerosis of aorta: Secondary | ICD-10-CM | POA: Diagnosis not present

## 2018-07-31 DIAGNOSIS — R2681 Unsteadiness on feet: Secondary | ICD-10-CM | POA: Diagnosis not present

## 2018-07-31 DIAGNOSIS — E1165 Type 2 diabetes mellitus with hyperglycemia: Secondary | ICD-10-CM | POA: Diagnosis not present

## 2018-07-31 DIAGNOSIS — Z7984 Long term (current) use of oral hypoglycemic drugs: Secondary | ICD-10-CM | POA: Diagnosis not present

## 2018-07-31 DIAGNOSIS — R296 Repeated falls: Secondary | ICD-10-CM | POA: Diagnosis not present

## 2018-07-31 DIAGNOSIS — I10 Essential (primary) hypertension: Secondary | ICD-10-CM | POA: Diagnosis not present

## 2018-08-03 ENCOUNTER — Emergency Department (HOSPITAL_COMMUNITY): Payer: Medicare Other

## 2018-08-03 ENCOUNTER — Encounter (HOSPITAL_COMMUNITY): Payer: Self-pay | Admitting: Emergency Medicine

## 2018-08-03 ENCOUNTER — Emergency Department (HOSPITAL_COMMUNITY)
Admission: EM | Admit: 2018-08-03 | Discharge: 2018-08-04 | Disposition: A | Discharge status: Home / Self Care | Payer: Medicare Other | Attending: Emergency Medicine | Admitting: Emergency Medicine

## 2018-08-03 ENCOUNTER — Other Ambulatory Visit: Payer: Self-pay

## 2018-08-03 DIAGNOSIS — F0390 Unspecified dementia without behavioral disturbance: Secondary | ICD-10-CM | POA: Insufficient documentation

## 2018-08-03 DIAGNOSIS — M25561 Pain in right knee: Secondary | ICD-10-CM | POA: Insufficient documentation

## 2018-08-03 DIAGNOSIS — M1120 Other chondrocalcinosis, unspecified site: Secondary | ICD-10-CM | POA: Diagnosis not present

## 2018-08-03 DIAGNOSIS — K5730 Diverticulosis of large intestine without perforation or abscess without bleeding: Secondary | ICD-10-CM | POA: Diagnosis not present

## 2018-08-03 DIAGNOSIS — R5381 Other malaise: Secondary | ICD-10-CM | POA: Diagnosis not present

## 2018-08-03 DIAGNOSIS — M11262 Other chondrocalcinosis, left knee: Secondary | ICD-10-CM | POA: Diagnosis not present

## 2018-08-03 DIAGNOSIS — Z7984 Long term (current) use of oral hypoglycemic drugs: Secondary | ICD-10-CM | POA: Diagnosis not present

## 2018-08-03 DIAGNOSIS — I10 Essential (primary) hypertension: Secondary | ICD-10-CM | POA: Diagnosis not present

## 2018-08-03 DIAGNOSIS — R531 Weakness: Secondary | ICD-10-CM | POA: Diagnosis not present

## 2018-08-03 DIAGNOSIS — R509 Fever, unspecified: Secondary | ICD-10-CM | POA: Diagnosis not present

## 2018-08-03 DIAGNOSIS — R1033 Periumbilical pain: Secondary | ICD-10-CM | POA: Diagnosis not present

## 2018-08-03 DIAGNOSIS — M7989 Other specified soft tissue disorders: Secondary | ICD-10-CM | POA: Diagnosis not present

## 2018-08-03 DIAGNOSIS — R404 Transient alteration of awareness: Secondary | ICD-10-CM | POA: Diagnosis not present

## 2018-08-03 DIAGNOSIS — M25462 Effusion, left knee: Secondary | ICD-10-CM | POA: Diagnosis not present

## 2018-08-03 DIAGNOSIS — M11261 Other chondrocalcinosis, right knee: Secondary | ICD-10-CM | POA: Diagnosis not present

## 2018-08-03 DIAGNOSIS — Z79899 Other long term (current) drug therapy: Secondary | ICD-10-CM | POA: Insufficient documentation

## 2018-08-03 DIAGNOSIS — E119 Type 2 diabetes mellitus without complications: Secondary | ICD-10-CM | POA: Insufficient documentation

## 2018-08-03 DIAGNOSIS — M25562 Pain in left knee: Secondary | ICD-10-CM | POA: Insufficient documentation

## 2018-08-03 LAB — COMPREHENSIVE METABOLIC PANEL
ALT: 10 U/L (ref 0–44)
AST: 11 U/L — ABNORMAL LOW (ref 15–41)
Albumin: 3.2 g/dL — ABNORMAL LOW (ref 3.5–5.0)
Alkaline Phosphatase: 52 U/L (ref 38–126)
Anion gap: 11 (ref 5–15)
BUN: 10 mg/dL (ref 8–23)
CO2: 27 mmol/L (ref 22–32)
Calcium: 8.9 mg/dL (ref 8.9–10.3)
Chloride: 101 mmol/L (ref 98–111)
Creatinine, Ser: 0.62 mg/dL (ref 0.44–1.00)
GFR calc Af Amer: >60 mL/min (ref >60)
GFR calc non Af Amer: >60 mL/min (ref >60)
Glucose, Bld: 154 mg/dL — ABNORMAL HIGH (ref 70–99)
Potassium: 3.7 mmol/L (ref 3.5–5.1)
Sodium: 139 mmol/L (ref 135–145)
Total Bilirubin: 0.8 mg/dL (ref 0.3–1.2)
Total Protein: 7.5 g/dL (ref 6.5–8.1)

## 2018-08-03 LAB — I-STAT TROPONIN, ED: Troponin i, poc: 0 ng/mL (ref 0.00–0.08)

## 2018-08-03 LAB — CBC WITH DIFFERENTIAL/PLATELET
Abs Immature Granulocytes: 0.07 10*3/uL (ref 0.00–0.07)
Basophils Absolute: 0 10*3/uL (ref 0.0–0.1)
Basophils Relative: 0 %
Eosinophils Absolute: 0 10*3/uL (ref 0.0–0.5)
Eosinophils Relative: 0 %
HCT: 40.4 % (ref 36.0–46.0)
Hemoglobin: 12.8 g/dL (ref 12.0–15.0)
Immature Granulocytes: 1 %
Lymphocytes Relative: 11 %
Lymphs Abs: 1.1 10*3/uL (ref 0.7–4.0)
MCH: 29.9 pg (ref 26.0–34.0)
MCHC: 31.7 g/dL (ref 30.0–36.0)
MCV: 94.4 fL (ref 80.0–100.0)
Monocytes Absolute: 0.8 10*3/uL (ref 0.1–1.0)
Monocytes Relative: 8 %
Neutro Abs: 7.9 10*3/uL — ABNORMAL HIGH (ref 1.7–7.7)
Neutrophils Relative %: 80 %
Platelets: 311 10*3/uL (ref 150–400)
RBC: 4.28 MIL/uL (ref 3.87–5.11)
RDW: 13.6 % (ref 11.5–15.5)
WBC: 10 10*3/uL (ref 4.0–10.5)
nRBC: 0 % (ref 0.0–0.2)

## 2018-08-03 LAB — I-STAT CG4 LACTIC ACID, ED: Lactic Acid, Venous: 1.12 mmol/L (ref 0.5–1.9)

## 2018-08-03 MED ORDER — ACETAMINOPHEN 500 MG PO TABS
1000.0000 mg | ORAL_TABLET | Freq: Once | ORAL | Status: AC
  Administered 2018-08-03: 1000 mg via ORAL
  Filled 2018-08-03: qty 2

## 2018-08-03 MED ORDER — IOPAMIDOL (ISOVUE-300) INJECTION 61%
100.0000 mL | Freq: Once | INTRAVENOUS | Status: AC | PRN
  Administered 2018-08-03: 100 mL via INTRAVENOUS

## 2018-08-03 MED ORDER — LIDOCAINE HCL (PF) 1 % IJ SOLN
5.0000 mL | Freq: Once | INTRAMUSCULAR | Status: AC
  Administered 2018-08-03: 5 mL via INTRADERMAL
  Filled 2018-08-03: qty 30

## 2018-08-03 MED ORDER — SODIUM CHLORIDE 0.9 % IV BOLUS
500.0000 mL | Freq: Once | INTRAVENOUS | Status: AC
  Administered 2018-08-03: 500 mL via INTRAVENOUS

## 2018-08-03 MED ORDER — IOPAMIDOL (ISOVUE-300) INJECTION 61%
INTRAVENOUS | Status: AC
  Filled 2018-08-03: qty 100

## 2018-08-03 MED ORDER — SODIUM CHLORIDE 0.9 % IJ SOLN
INTRAMUSCULAR | Status: AC
  Filled 2018-08-03: qty 50

## 2018-08-03 NOTE — ED Notes (Signed)
Patient transported to X-ray 

## 2018-08-03 NOTE — ED Notes (Signed)
[name] (Son)- [phone] [name] [phone] [name]- [phone]

## 2018-08-03 NOTE — ED Notes (Signed)
Bed: ME26 Expected date:  Expected time:  Means of arrival:  Comments: EMS-FTT

## 2018-08-03 NOTE — ED Triage Notes (Signed)
Patient arrived by EMS from home. Pt has has weakness and unable to eat.Hx of dementia, diabetes, alzheimer's, HTN, and a-fib per EMS. CBG 172 , BP 158/72, HR 78, 96% on RA, RR 17

## 2018-08-03 NOTE — ED Provider Notes (Signed)
[name] DEPT Provider Note   CSN: [id number] Arrival date & time: 08/03/18  1501     History   Chief Complaint Chief Complaint  Patient presents with  . Weakness    HPI [name] is a 84 y.o. female who presents to the ER with fever and ambulatory issues. There is a level 5 caveat due to dementia.  Her family states that over the past 2 weeks she has essentially lost her ability to ambulate.  She has been complaining of knee pain in the past 2 days they saw that both of her knees became very swollen and erythematous.  Today she tried to get up from a seated position was unable to stand up without her legs buckling.  They noticed also that over the past week she has been more lethargic with worsening confusion.  Normally she is able to ambulate with a walker.  She lives at their house and usually tells them when she needs to the bathroom and then can navigate to the bathroom with some help however she has been sitting still and urinating on herself which is very abnormal for her.  The patient was noted to have a fever today.  They deny foul odor of urine or hematuria.  She has had some constipation.   HPI  Past Medical History:  Diagnosis Date  . Dementia ([place])   . Diabetes mellitus without complication ([place])   . Glaucoma   . Hypertension     Patient Active Problem List   Diagnosis Date Noted  . Cough   . Allergic rhinitis   . Gastroesophageal reflux disease   . Hypertension 09/12/2017  . Diabetes mellitus without complication ([place]) 90/24/0973  . Dementia ([place]) 09/12/2017  . Hyponatremia 09/12/2017  . 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 TIA (transient ischemic attack) 09/12/2017    History reviewed. No pertinent surgical history.   OB History   None      Home Medications    Prior to Admission medications   Medication Sig Start Date End Date Taking? Authorizing Provider  acetaminophen (TYLENOL) 650 MG CR tablet  Take 650 mg by mouth daily as needed for pain.   Yes [provider]  amLODipine (NORVASC) 5 MG tablet Take 5 mg by mouth daily.   Yes [provider]  celecoxib (CELEBREX) 200 MG capsule Take 200 mg by mouth daily. 07/01/18  Yes [provider]  clotrimazole-betamethasone (LOTRISONE) cream Apply 1 application topically daily. 07/01/18  Yes [provider]  divalproex (DEPAKOTE) 250 MG DR tablet Take 250 mg by mouth 2 (two) times daily.   Yes [provider]  dorzolamide-timolol (COSOPT) 22.3-6.8 MG/ML ophthalmic solution Place 1 drop into both eyes 3 (three) times daily. 07/19/18  Yes [provider]  fluticasone (FLONASE) 50 MCG/ACT nasal spray Place 1 spray into both nostrils daily. 09/13/17  Yes Barton Dubois, MD  glipiZIDE (GLUCOTROL) 5 MG tablet Take 5 mg by mouth daily before breakfast.   Yes [provider]  loratadine (CLARITIN) 10 MG tablet Take 1 tablet (10 mg total) by mouth daily. 09/13/17  Yes Barton Dubois, MD  Memantine HCl-Donepezil HCl [facility]) 28-10 MG CP24 Take 1 capsule by mouth daily.   Yes [provider]  pantoprazole (PROTONIX) 40 MG tablet Take 1 tablet (40 mg total) by mouth daily. 09/13/17 09/13/18 Yes Barton Dubois, MD  promethazine-dextromethorphan (PROMETHAZINE-DM) 6.25-15 MG/5ML syrup Take 5 mLs by mouth every 6 (six) hours as needed for cough or  congestion. 07/14/18  Yes [provider]  TRAVATAN Z 0.004 % SOLN ophthalmic solution Place 1 drop into both eyes at bedtime. 06/29/18  Yes [provider]  travoprost, benzalkonium, (TRAVATAN) 0.004 % ophthalmic solution Place 1 drop into both eyes at bedtime.   Yes [provider]  aspirin 325 MG tablet Take 1 tablet (325 mg total) by mouth daily. Patient not taking: Reported on 08/03/2018 09/13/17   Barton Dubois, MD  ASPIRIN 81 PO Take by mouth.    [provider]  guaiFENesin (ROBITUSSIN) 100 MG/5ML SOLN Take 10  mLs (200 mg total) by mouth every 8 (eight) hours as needed for cough. Patient not taking: Reported on 08/03/2018 09/13/17   Barton Dubois, MD    Family History No family history on file.  Social History Social History   Tobacco Use  . Smoking status: Never Smoker  . Smokeless tobacco: Never Used  Substance Use Topics  . Alcohol use: No    Frequency: Never  . Drug use: No     Allergies   Patient has no known allergies.   Review of Systems Review of Systems Ten systems reviewed and are negative for acute change, except as noted in the HPI.    Physical Exam Updated Vital Signs BP (!) 170/87   Pulse 68   Temp (!) 101 F (38.3 C) (Rectal)   Resp 19   Ht 5\' 3"  (1.6 m)   Wt 61.2 kg   SpO2 96%   BMI 23.91 kg/m   Physical Exam  Constitutional: She is oriented to person, place, and time. She appears well-developed and well-nourished. No distress.  HENT:  Head: Normocephalic and atraumatic.  Eyes: Conjunctivae are normal. No scleral icterus.  Neck: Normal range of motion.  Cardiovascular: Normal rate, regular rhythm and normal heart sounds. Exam reveals no gallop and no friction rub.  No murmur heard. Pulmonary/Chest: Effort normal and breath sounds normal. No respiratory distress.  Abdominal: Soft. Bowel sounds are normal. She exhibits distension. She exhibits no mass. There is tenderness (around the umbilicus). There is no guarding.  Musculoskeletal:  Bilateral heat, redness and swelling of the knees.  There is an effusion present on the right knee.  Patient yells whenever I tried to mobilize knee due to pain.  Neurological: She is alert and oriented to person, place, and time.  Skin: Skin is warm and dry. She is not diaphoretic.  Psychiatric: Her behavior is normal.  Nursing note and vitals reviewed.    ED Treatments / Results  Labs (all labs ordered are listed, but only abnormal results are displayed) Labs Reviewed  CBC WITH DIFFERENTIAL/PLATELET -  Abnormal; Notable for the following components:      Result Value   Neutro Abs 7.9 (*)    All other components within normal limits  CULTURE, BLOOD (ROUTINE X 2)  CULTURE, BLOOD (ROUTINE X 2)  BODY FLUID CULTURE  GRAM STAIN  COMPREHENSIVE METABOLIC PANEL  URINALYSIS, ROUTINE W REFLEX MICROSCOPIC  SYNOVIAL CELL COUNT + DIFF, W/ CRYSTALS  GLUCOSE, BODY FLUID OTHER  I-STAT CG4 LACTIC ACID, ED  I-STAT TROPONIN, ED  CBG MONITORING, ED    EKG EKG Interpretation  Date/Time:  Monday August 03 2018 15:21:01 EDT Ventricular Rate:  83 PR Interval:    QRS Duration: 114 QT Interval:  373 QTC Calculation: 439 R Axis:   -35 Text Interpretation:  Sinus rhythm Borderline IVCD with LAD Borderline T abnormalities, anterior leads No STEMI Confirmed by Nanda Quinton [phone]) on 08/03/2018 5:09:28  PM   Radiology Dg Chest Port 1 View  Result Date: 08/03/2018 CLINICAL DATA:  Weakness, fever, anorexia, dementia, diabetes mellitus, Alzheimer's, hypertension, atrial fibrillation EXAM: PORTABLE CHEST 1 VIEW COMPARISON:  Portable exam 1653 hours compared to 09/12/2017 FINDINGS: Upper normal heart size with normal pulmonary vascularity. Atherosclerotic calcification aorta. Again identified abnormal soft tissue RIGHT paratracheal, by prior CT likely due to a tortuous innominate artery. Lungs clear. No acute infiltrate, pleural effusion, or pneumothorax. Bones demineralized. IMPRESSION: No acute abnormalities. Electronically Signed   By: Lavonia Dana M.D.   On: 08/03/2018 17:13    Procedures .Joint Aspiration/Arthrocentesis Date/Time: 08/03/2018 10:18 PM Performed by: Margarita Mail, PA-C Authorized by: Margarita Mail, PA-C   Consent:    Consent obtained:  Verbal   Consent given by:  Guardian   Risks discussed:  Bleeding, incomplete drainage, infection and pain   Alternatives discussed:  No treatment Location:    Location:  Knee   Knee:  R knee Anesthesia (see MAR for exact dosages):     Anesthesia method:  Local infiltration   Local anesthetic:  Lidocaine 1% w/o epi Procedure details:    Preparation: Patient was prepped and draped in usual sterile fashion     Needle gauge:  18 G   Ultrasound guidance: no     Approach:  Superior   Aspirate characteristics:  Yellow and cloudy   Steroid injected: no     Specimen collected: no   Post-procedure details:    Patient tolerance of procedure:  Tolerated well, no immediate complications   (including critical care time)  Medications Ordered in ED Medications  acetaminophen (TYLENOL) tablet 1,000 mg (1,000 mg Oral Given 08/03/18 1735)     Initial Impression / Assessment and Plan / ED Course  I have reviewed the triage vital signs and the nursing notes.  Pertinent labs & imaging results that were available during my care of the patient were reviewed by me and considered in my medical decision making (see chart for details).    84 year old female presenting with fever, difficulty walking, and joint swelling in the both knees.  She was febrile upon arrival.  She had no tachycardia or hypotension suggestive of sepsis.  She has no leukocytosis.  Blood sugar was slightly elevated.  Chest x-ray, bilateral knee x-ray and CT abdomen pelvis showed no significant abnormality.  She did have some constipation and degenerative changes of the knee with an effusion on the right.  Personally reviewed these films.  Her urine is currently pending.  I also performed an arthrocentesis and fluid is still being analyzed in the lab.  Differential includes gout or other inflammatory arthritis, septic joint.  She may also have a urinary tract infection and inflamed joints from arthritis.  Cannot find another source for her fever at this time.  I have given sign out to PA [place] who will assume care for appropriate discharge.  Final Clinical Impressions(s) / ED Diagnoses   Final diagnoses:  Pseudogout  Acute pain of both knees    ED Discharge Orders      None       Margarita Mail, PA-C 08/05/18 1517    Long, Wonda [facility], MD 08/06/18 [phone]

## 2018-08-03 NOTE — ED Notes (Signed)
Aware urine sample is needed. Pt has a purwik placed awaiting specimen.

## 2018-08-04 DIAGNOSIS — M2550 Pain in unspecified joint: Secondary | ICD-10-CM | POA: Diagnosis not present

## 2018-08-04 DIAGNOSIS — R0902 Hypoxemia: Secondary | ICD-10-CM | POA: Diagnosis not present

## 2018-08-04 DIAGNOSIS — R52 Pain, unspecified: Secondary | ICD-10-CM | POA: Diagnosis not present

## 2018-08-04 DIAGNOSIS — Z7401 Bed confinement status: Secondary | ICD-10-CM | POA: Diagnosis not present

## 2018-08-04 LAB — SYNOVIAL CELL COUNT + DIFF, W/ CRYSTALS
Eosinophils-Synovial: 0 % (ref 0–1)
Lymphocytes-Synovial Fld: 1 % (ref 0–20)
Monocyte-Macrophage-Synovial Fluid: 10 % — ABNORMAL LOW (ref 50–90)
Neutrophil, Synovial: 89 % — ABNORMAL HIGH (ref 0–25)
WBC, Synovial: 10480 /mm3 — ABNORMAL HIGH (ref 0–200)

## 2018-08-04 LAB — URINALYSIS, ROUTINE W REFLEX MICROSCOPIC
Bacteria, UA: NONE SEEN
Bilirubin Urine: NEGATIVE
Glucose, UA: NEGATIVE mg/dL
Ketones, ur: 20 mg/dL — AB
Leukocytes, UA: NEGATIVE
Nitrite: NEGATIVE
Protein, ur: NEGATIVE mg/dL
Specific Gravity, Urine: 1.03 (ref 1.005–1.030)
pH: 7 (ref 5.0–8.0)

## 2018-08-04 MED ORDER — HYDROCODONE-ACETAMINOPHEN 5-325 MG PO TABS: 1.0000 | ORAL_TABLET | ORAL | 0 refills | Status: DC | PRN

## 2018-08-04 MED ORDER — NAPROXEN 500 MG PO TABS: 500.0000 mg | ORAL_TABLET | Freq: Two times a day (BID) | ORAL | 0 refills | Status: DC

## 2018-08-04 NOTE — ED Provider Notes (Signed)
Assumed care from [place] and Dr. Laverta Baltimore at shift change.  See prior notes for full H&P.  Briefly, 84 year old female with history of dementia here with difficulty walking.  She has been complaining of knee pain, family has noticed some swelling and erythema over the joint.  States she has had a little bit of confusion, demented at baseline.  She has had episodes where she accidentally urinated on herself, unsure if this is due to confusion over her difficulty getting to the bathroom.  Labs and imaging studies overall reassuring.  She was febrile on arrival here without tachycardia or hypotension.  Not felt to represent sepsis.  Imaging studies including films of her knees, chest x-ray, and CT abdomen pelvis without significant findings aside from some constipation.  Plan: Arthrocentesis has been performed of her knee.  Awaiting fluid analysis as well as urinalysis.  If these are reassuring, likely discharge.  Results for orders placed or performed during the hospital encounter of 08/03/18  Body fluid culture  Result Value Ref Range   Specimen Description SYNOVIAL KNEE    Special Requests Normal    Gram Stain      NO ORGANISMS SEEN WBC PRESENT,BOTH PMN AND MONONUCLEAR CYTOSPIN SMEAR Gram Stain Report Called to,Read Back By and Verified With: [facility] RN AT 0010 08/04/18 BY TIBBITTS,K Performed at [facility], [place] [address], [place], [place] [address]    Culture PENDING    Report Status PENDING   Comprehensive metabolic panel  Result Value Ref Range   Sodium 139 135 - 145 mmol/L   Potassium 3.7 3.5 - 5.1 mmol/L   Chloride 101 98 - 111 mmol/L   CO2 27 22 - 32 mmol/L   Glucose, Bld 154 (H) 70 - 99 mg/dL   BUN 10 8 - 23 mg/dL   Creatinine, Ser 0.62 0.44 - 1.00 mg/dL   Calcium 8.9 8.9 - 10.3 mg/dL   Total Protein 7.5 6.5 - 8.1 g/dL   Albumin 3.2 (L) 3.5 - 5.0 g/dL   AST 11 (L) 15 - 41 U/L   ALT 10 0 - 44 U/L   Alkaline Phosphatase 52 38 - 126 U/L   Total Bilirubin 0.8  0.3 - 1.2 mg/dL   GFR calc non Af Amer >60 >60 mL/min   GFR calc Af Amer >60 >60 mL/min   Anion gap 11 5 - 15  CBC WITH DIFFERENTIAL  Result Value Ref Range   WBC 10.0 4.0 - 10.5 K/uL   RBC 4.28 3.87 - 5.11 MIL/uL   Hemoglobin 12.8 12.0 - 15.0 g/dL   HCT 40.4 36.0 - 46.0 %   MCV 94.4 80.0 - 100.0 fL   MCH 29.9 26.0 - 34.0 pg   MCHC 31.7 30.0 - 36.0 g/dL   RDW 13.6 11.5 - 15.5 %   Platelets 311 150 - 400 K/uL   nRBC 0.0 0.0 - 0.2 %   Neutrophils Relative % 80 %   Neutro Abs 7.9 (H) 1.7 - 7.7 K/uL   Lymphocytes Relative 11 %   Lymphs Abs 1.1 0.7 - 4.0 K/uL   Monocytes Relative 8 %   Monocytes Absolute 0.8 0.1 - 1.0 K/uL   Eosinophils Relative 0 %   Eosinophils Absolute 0.0 0.0 - 0.5 K/uL   Basophils Relative 0 %   Basophils Absolute 0.0 0.0 - 0.1 K/uL   Immature Granulocytes 1 %   Abs Immature Granulocytes 0.07 0.00 - 0.07 K/uL  Urinalysis, Routine w reflex microscopic  Result Value Ref  Range   Color, Urine YELLOW YELLOW   APPearance CLEAR CLEAR   Specific Gravity, Urine 1.030 1.005 - 1.030   pH 7.0 5.0 - 8.0   Glucose, UA NEGATIVE NEGATIVE mg/dL   Hgb urine dipstick SMALL (A) NEGATIVE   Bilirubin Urine NEGATIVE NEGATIVE   Ketones, ur 20 (A) NEGATIVE mg/dL   Protein, ur NEGATIVE NEGATIVE mg/dL   Nitrite NEGATIVE NEGATIVE   Leukocytes, UA NEGATIVE NEGATIVE   RBC / HPF 0-5 0 - 5 RBC/hpf   WBC, UA 0-5 0 - 5 WBC/hpf   Bacteria, UA NONE SEEN NONE SEEN  Cell count + diff,  w/ cryst-synvl fld  Result Value Ref Range   Color, Synovial YELLOW YELLOW   Appearance-Synovial TURBID (A) CLEAR   Crystals, Fluid INTRACELLULAR CALCIUM PYROPHOSPHATE CRYSTALS    WBC, Synovial 10,480 (H) 0 - 200 /cu mm   Neutrophil, Synovial 89 (H) 0 - 25 %   Lymphocytes-Synovial Fld 1 0 - 20 %   Monocyte-Macrophage-Synovial Fluid 10 (L) 50 - 90 %   Eosinophils-Synovial 0 0 - 1 %   Other Cells-SYN CYTOSPIN   I-Stat CG4 Lactic Acid, ED  (not at  [facility])  Result Value Ref Range   Lactic Acid, Venous  1.12 0.5 - 1.9 mmol/L  I-stat troponin, ED (not at [facility])  Result Value Ref Range   Troponin i, poc 0.00 0.00 - 0.08 ng/mL   Comment 3           Ct Abdomen Pelvis W Contrast  Result Date: 08/03/2018 CLINICAL DATA:  Weakness and not able to eat. Abdominal pain and distention. History of dementia, diabetes, Alzheimer's. EXAM: CT ABDOMEN AND PELVIS WITH CONTRAST TECHNIQUE: Multidetector CT imaging of the abdomen and pelvis was performed using the standard protocol following bolus administration of intravenous contrast. CONTRAST:  156mL ISOVUE-300 IOPAMIDOL (ISOVUE-300) INJECTION 61% COMPARISON:  09/12/2017 FINDINGS: Lower chest: Atelectasis in both lung bases. Cardiac enlargement. Hepatobiliary: Mild diffuse fatty infiltration of the liver. Cyst in the dome of the liver. Gallbladder and bile ducts are unremarkable. Pancreas: Unremarkable. No pancreatic ductal dilatation or surrounding inflammatory changes. Spleen: Normal in size without focal abnormality. Adrenals/Urinary Tract: No adrenal gland nodules. Renal nephrograms are symmetrical. Small cortical cysts in both kidneys. No hydronephrosis or hydroureter. Bladder is unremarkable. Stomach/Bowel: Stomach is within normal limits. Appendix appears normal. No evidence of bowel wall thickening, distention, or inflammatory changes. Stool throughout the colon. Diverticula in the sigmoid colon without evidence of diverticulitis. Vascular/Lymphatic: Aortic atherosclerosis. No enlarged abdominal or pelvic lymph nodes. Reproductive: Status post hysterectomy. No adnexal masses. Other: No free air or free fluid in the abdomen. Periumbilical hernia containing fat. Musculoskeletal: Degenerative changes in the spine. Compression of the superior endplate of V42. No change since previous study. Degenerative changes in the hips. IMPRESSION: 1. No acute process demonstrated in the abdomen or pelvis. No evidence of bowel obstruction or inflammation. Stool throughout the  colon. Diverticulosis without evidence of diverticulitis. 2. Diffuse fatty infiltration of the liver. Benign-appearing cysts in the dome of the liver. 3. Periumbilical hernia containing fat. 4. Degenerative changes in the spine and hips. Aortic Atherosclerosis (ICD10-I70.0). Electronically Signed   By: Lucienne Capers M.D.   On: 08/03/2018 22:12   Dg Chest Port 1 View  Result Date: 08/03/2018 CLINICAL DATA:  Weakness, fever, anorexia, dementia, diabetes mellitus, Alzheimer's, hypertension, atrial fibrillation EXAM: PORTABLE CHEST 1 VIEW COMPARISON:  Portable exam 1653 hours compared to 09/12/2017 FINDINGS: Upper normal heart size with normal pulmonary vascularity. Atherosclerotic calcification  aorta. Again identified abnormal soft tissue RIGHT paratracheal, by prior CT likely due to a tortuous innominate artery. Lungs clear. No acute infiltrate, pleural effusion, or pneumothorax. Bones demineralized. IMPRESSION: No acute abnormalities. Electronically Signed   By: Lavonia Dana M.D.   On: 08/03/2018 17:13   Dg Knee Complete 4 Views Left  Result Date: 08/03/2018 CLINICAL DATA:  84 year old female with dementia. Pain and swelling both knees without injury. Initial encounter. EXAM: LEFT KNEE - COMPLETE 4+ VIEW COMPARISON:  04/13/2018 FINDINGS: Dense menisci consistent with changes of chondrocalcinosis. Mild-to-moderate patellofemoral joint degenerative changes and mild medial and lateral tibiofemoral joint degenerative changes. Small joint effusion. No fracture or dislocation. Vascular calcifications. IMPRESSION: 1. Dense menisci consistent with changes of chondrocalcinosis. 2. Mild-to-moderate patellofemoral joint degenerative changes. 3. Mild medial and lateral tibiofemoral joint degenerative changes. 4. Small joint effusion. 5. No fracture or dislocation. Electronically Signed   By: Genia Del M.D.   On: 08/03/2018 19:09   Dg Knee Complete 4 Views Right  Result Date: 08/03/2018 CLINICAL DATA:   84 year old female with dementia. Pain and swelling both knees without injury. Initial encounter. EXAM: RIGHT KNEE - COMPLETE 4+ VIEW COMPARISON:  04/13/2018. FINDINGS: Dense menisci consistent with changes of chondrocalcinosis. Moderate to marked patellofemoral joint degenerative changes. Mild medial and lateral tibiofemoral joint degenerative changes. 1.8 cm os ossific structure projects over the infrapatellar fat pad and may be a loose body. Moderate-size joint effusion. Spur at the level of the quadriceps tendon insertion to the patella tendon. No fracture or dislocation. IMPRESSION: 1. Dense menisci consistent with changes of chondrocalcinosis. 2. Moderate to marked patellofemoral joint degenerative changes. 3. Mild medial and lateral tibiofemoral joint degenerative changes. 4. 1.8 cm os ossific structure projects over the infrapatellar fat pad and may be a loose body. 5. Moderate-size joint effusion. This has increased in size since prior exam. 6. No fracture or dislocation. Electronically Signed   By: Genia Del M.D.   On: 08/03/2018 19:13   Joint fluid with findings of pseudogout.  UA without signs of infection.  Patient has been resting comfortably here.  VSS.  Will plan to d/c home with pain control, close PCP follow-up.  Family has gone home for the night, I attempted to call her son, Marcello Moores, at number listed in chart however no answer.  [facility] will be contacted for transport home.  She will return here for any new/acute changes.   Larene Pickett, PA-C 08/04/18 [place]    Ripley Fraise, MD 08/04/18 [phone]

## 2018-08-04 NOTE — Discharge Instructions (Signed)
Joint fluid extracted today reveals something called pseudogout, this is similar to gout where crystals form in the joint causing pain. Urine test did not show any signs of infection. Recommend starting the medications I prescribed.  Monitor closely while taking pain medication as it can make her somewhat drowsy. Follow-up with your primary care doctor. Return here for any new or worsening symptoms.

## 2018-08-04 NOTE — ED Notes (Signed)
RN called pt's family to let them know pt was on the way home via [facility]

## 2018-08-04 NOTE — ED Notes (Signed)
[facility] called for transport.  

## 2018-08-05 LAB — GLUCOSE, BODY FLUID OTHER: Glucose, Body Fluid Other: 118 mg/dL

## 2018-08-07 LAB — BODY FLUID CULTURE
Culture: NO GROWTH
Gram Stain: NONE SEEN
Special Requests: NORMAL

## 2018-08-08 LAB — CULTURE, BLOOD (ROUTINE X 2)
Culture: NO GROWTH
Culture: NO GROWTH
Special Requests: ADEQUATE

## 2018-09-24 DIAGNOSIS — R296 Repeated falls: Secondary | ICD-10-CM | POA: Diagnosis not present

## 2018-09-24 DIAGNOSIS — M150 Primary generalized (osteo)arthritis: Secondary | ICD-10-CM | POA: Diagnosis not present

## 2018-09-24 DIAGNOSIS — R2681 Unsteadiness on feet: Secondary | ICD-10-CM | POA: Diagnosis not present

## 2019-05-11 IMAGING — CR DG KNEE COMPLETE 4+V*R*
4 series · 4 of 4 positions shown · non-contrast
Comparison: 04/13/2018.

CLINICAL DATA: [AGE] female with dementia. Pain and swelling
both knees without injury. Initial encounter.

EXAM:
RIGHT KNEE - COMPLETE 4+ VIEW

[x knee ap right (1 of 3)]
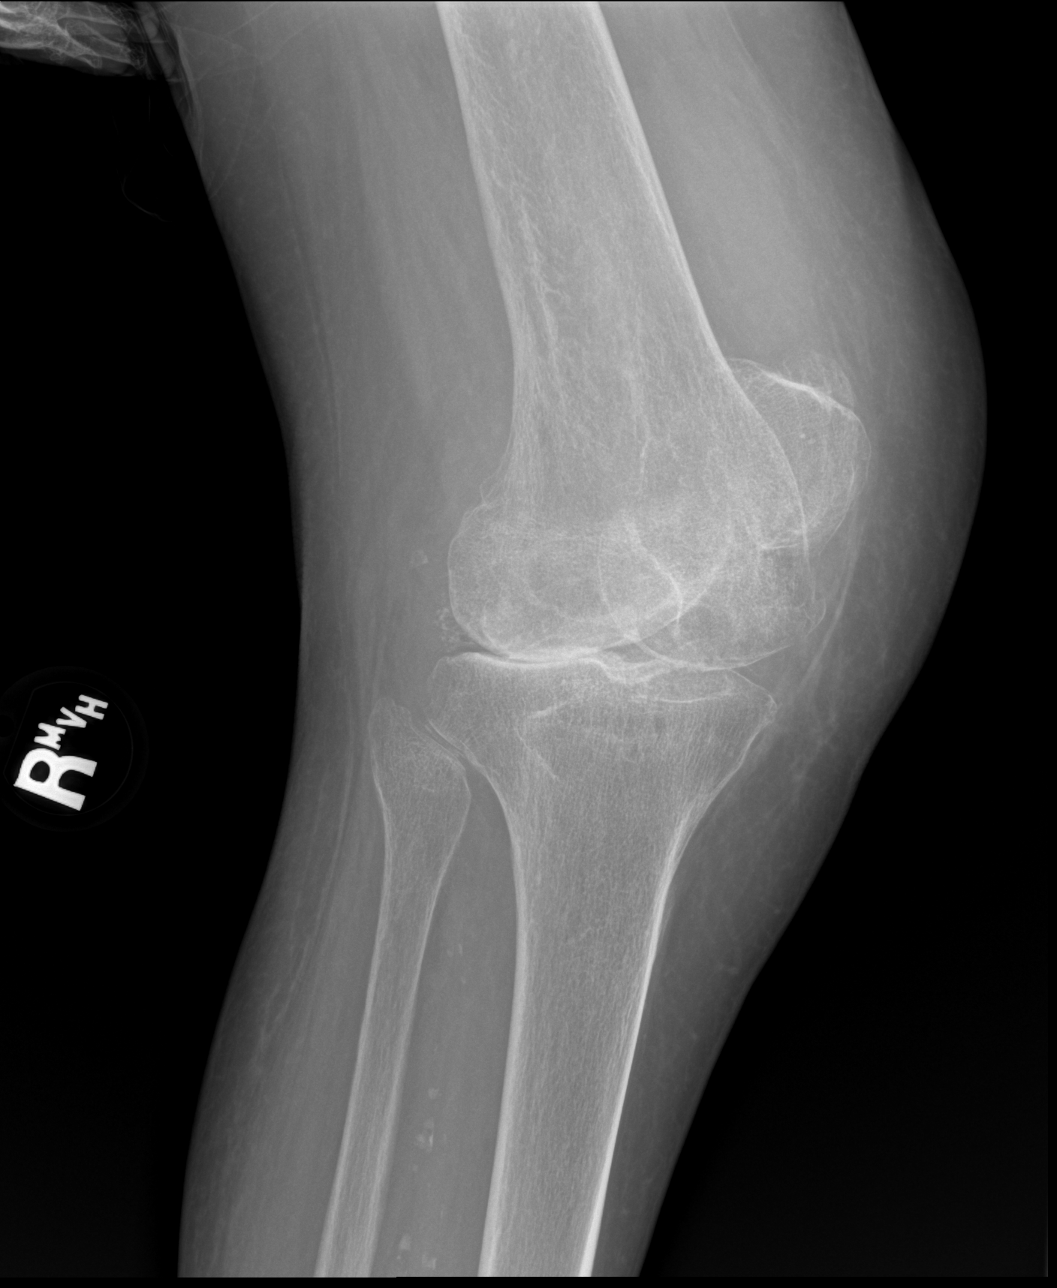

[x knee lat right]
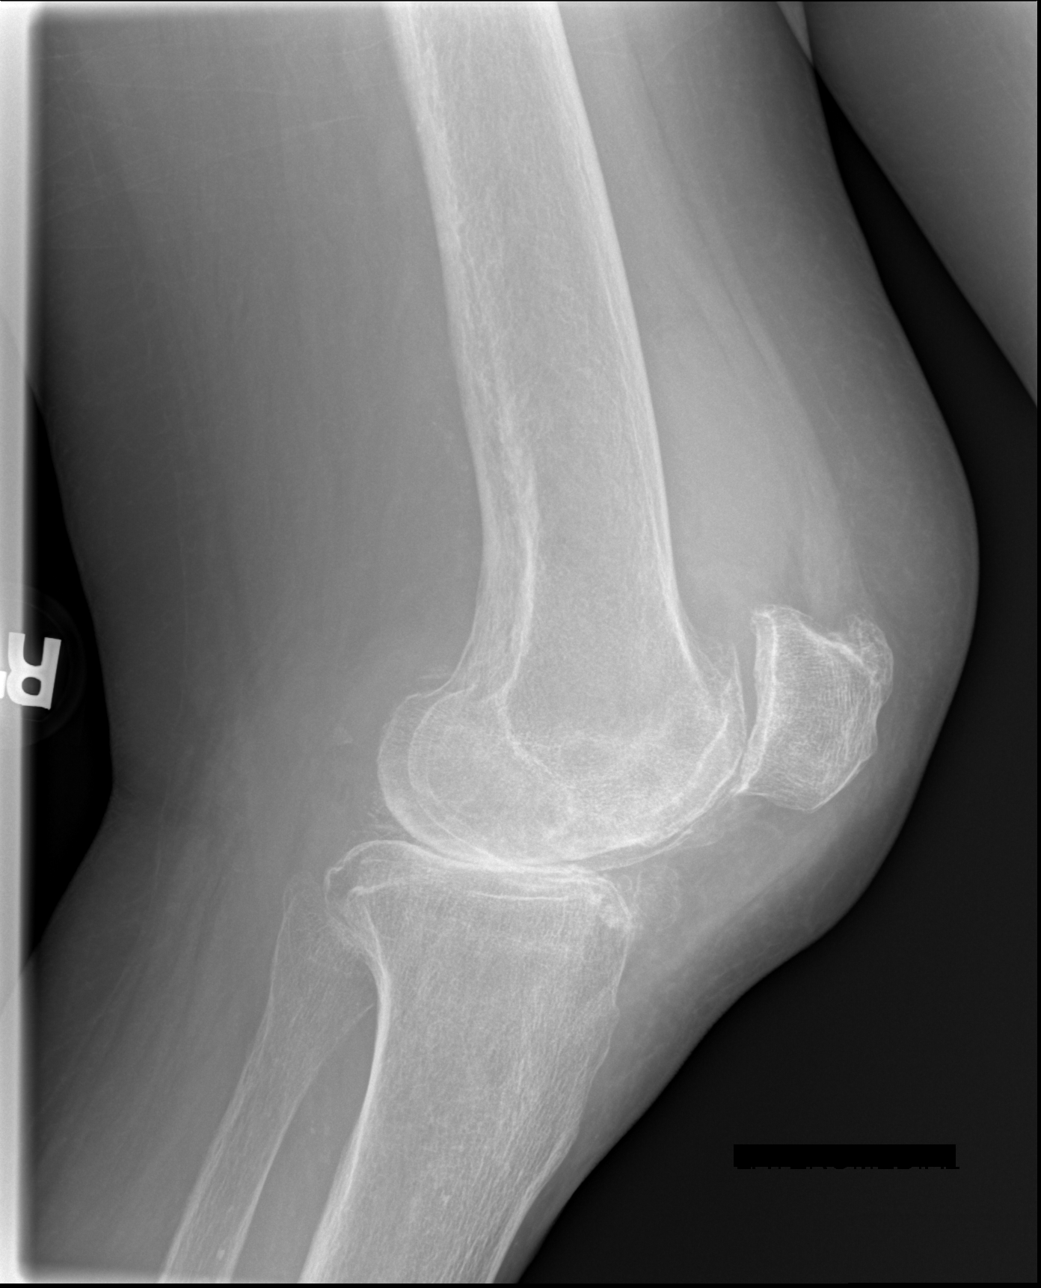

[x knee ap right (2 of 3)]
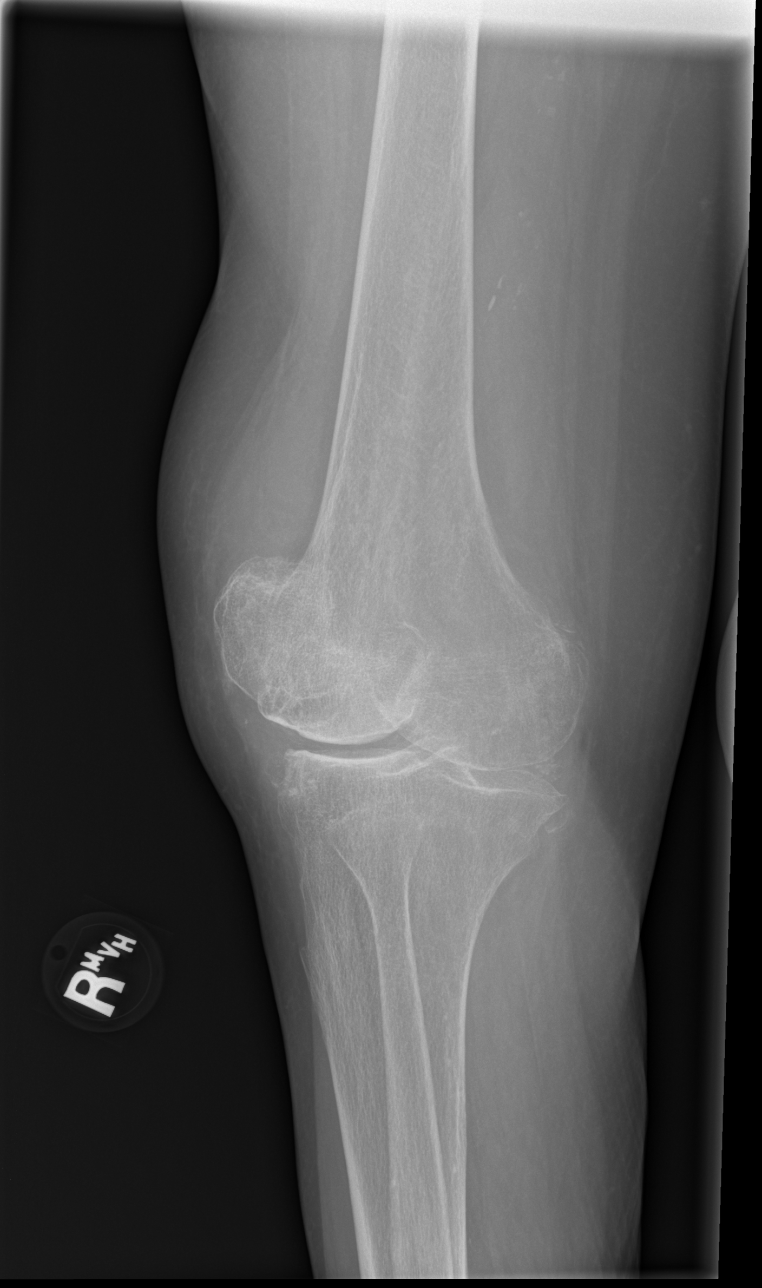

[x knee ap right (3 of 3)]
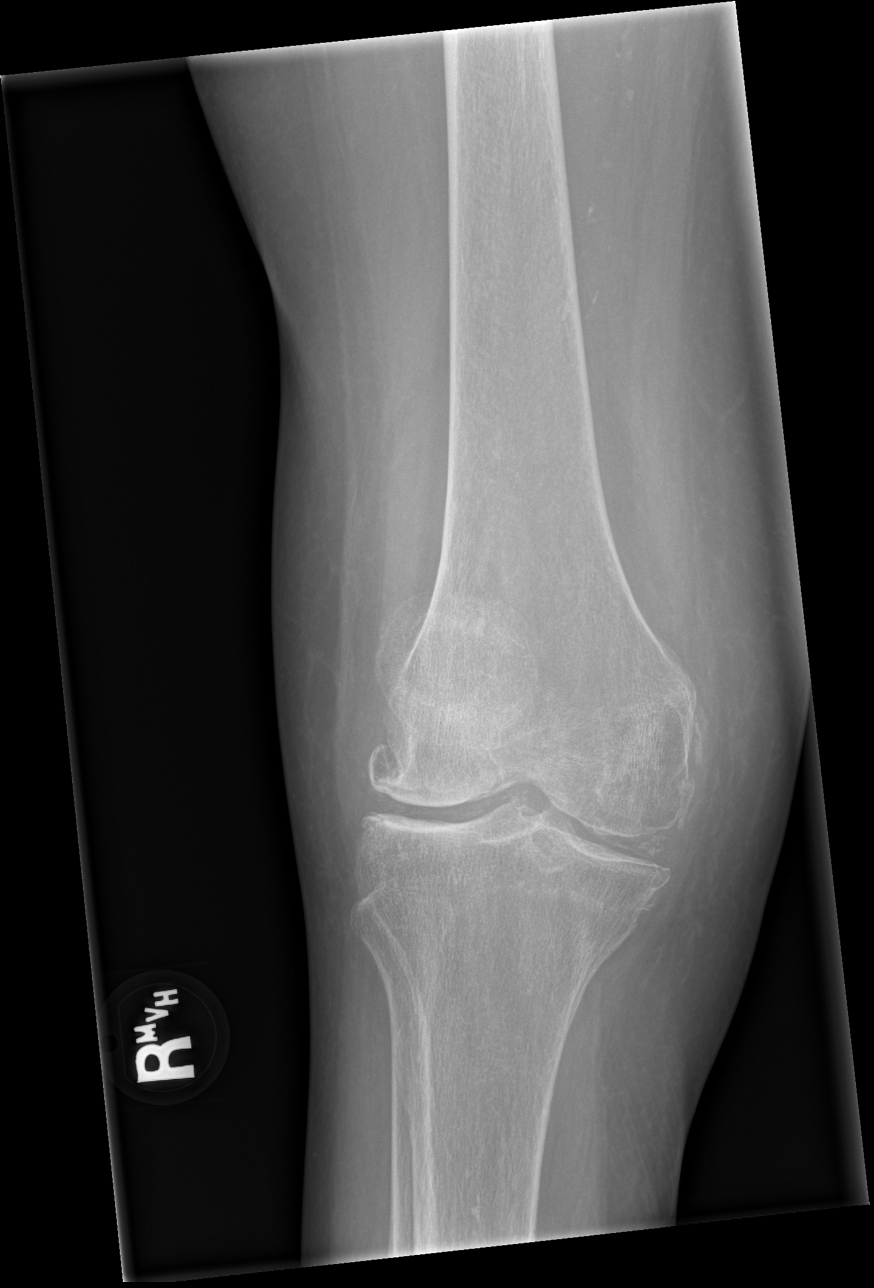

[4 of 4 positions shown; findings below may reference images not displayed]

FINDINGS: Dense menisci consistent with changes of chondrocalcinosis. Moderate
to marked patellofemoral joint degenerative changes. Mild medial and
lateral tibiofemoral joint degenerative changes. 1.8 cm os ossific
structure projects over the infrapatellar fat pad and may be a loose
body. Moderate-size joint effusion. Spur at the level of the
quadriceps tendon insertion to the patella tendon. No fracture or
dislocation.
IMPRESSION: 1. Dense menisci consistent with changes of chondrocalcinosis.
2. Moderate to marked patellofemoral joint degenerative changes.
3. Mild medial and lateral tibiofemoral joint degenerative changes.
4. 1.8 cm os ossific structure projects over the infrapatellar fat
pad and may be a loose body.
5. Moderate-size joint effusion. This has increased in size since
prior exam.
6. No fracture or dislocation.

## 2019-05-11 IMAGING — CT CT ABD-PELV W/ CM
2 of 5 series · 16 of 46 positions shown, 18 images · IV contrast (ISOVUE)
Comparison: 09/12/2017

CLINICAL DATA: Weakness and not able to eat. Abdominal pain and
distention. History of dementia, diabetes, Alzheimer's.

EXAM:
CT ABDOMEN AND PELVIS WITH CONTRAST
TECHNIQUE: Multidetector CT imaging of the abdomen and pelvis was performed
using the standard protocol following bolus administration of
intravenous contrast.
CONTRAST:  100mL HGK26C-5NN IOPAMIDOL (HGK26C-5NN) INJECTION 61%

[Series 2: axial st · axial · 0.72mm/px · z∈[+1248,+1558]mm · 13 of 72 slices shown, 15 images]
[im 5/72  soft-tissue]
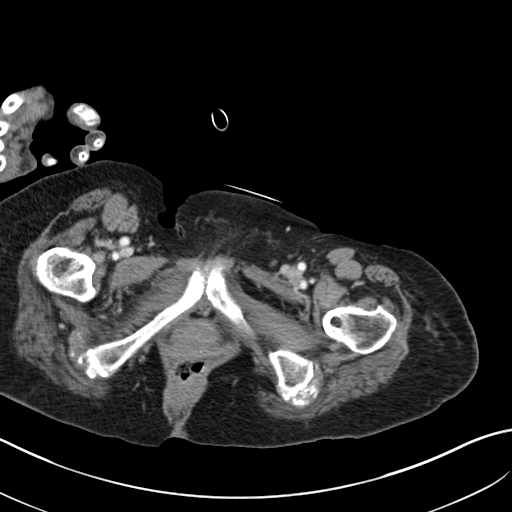
[im 5/72  bone]
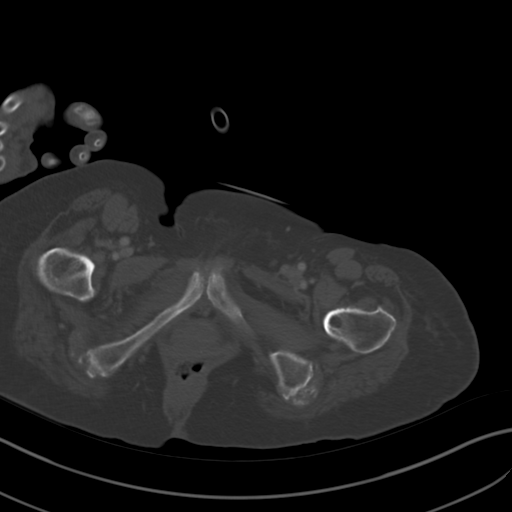
[im 9/72  soft-tissue]
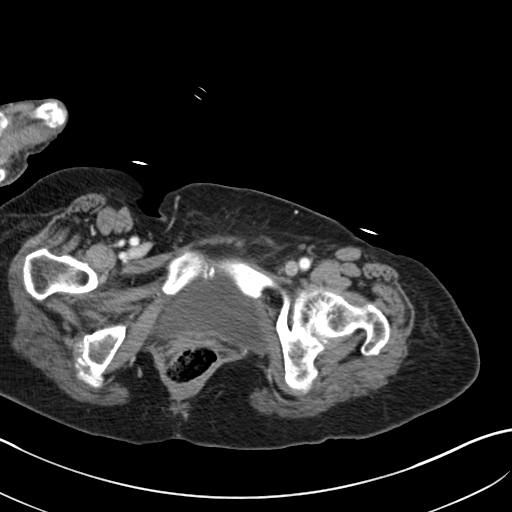
[im 14/72  soft-tissue]
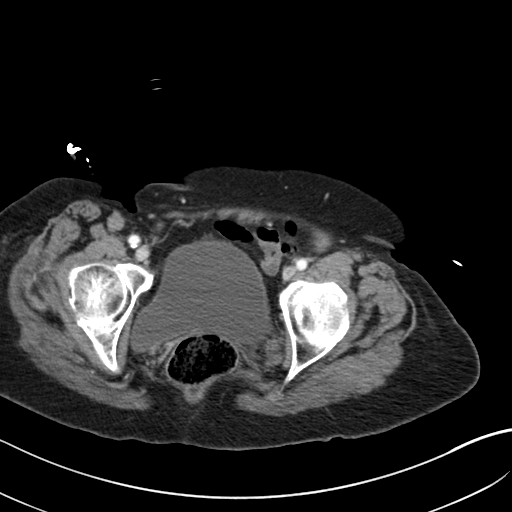
[im 23/72  soft-tissue]
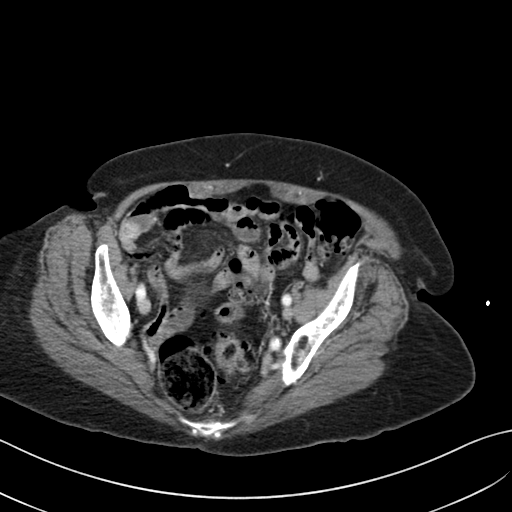
[im 27/72  soft-tissue]
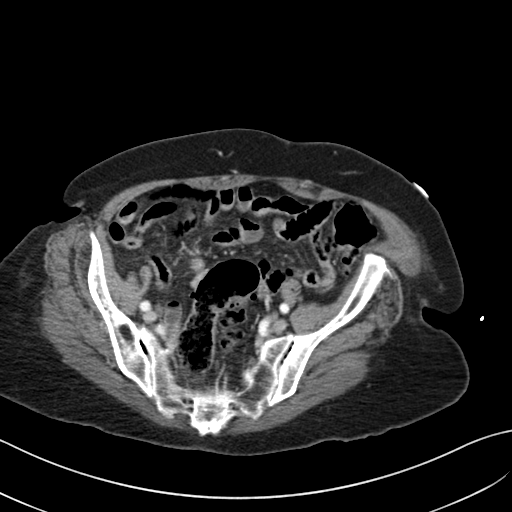
[im 32/72  soft-tissue]
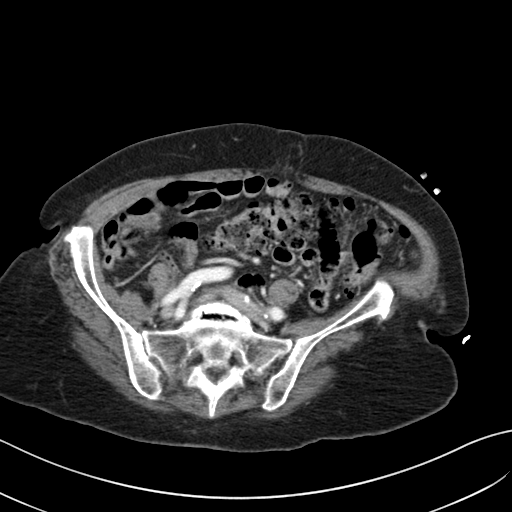
[im 36/72  soft-tissue]
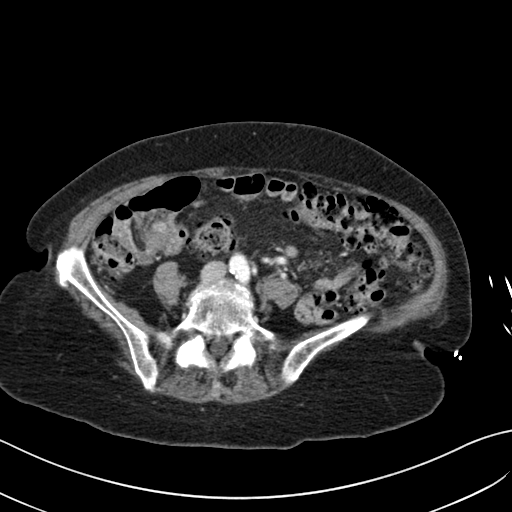
[im 40/72  soft-tissue]
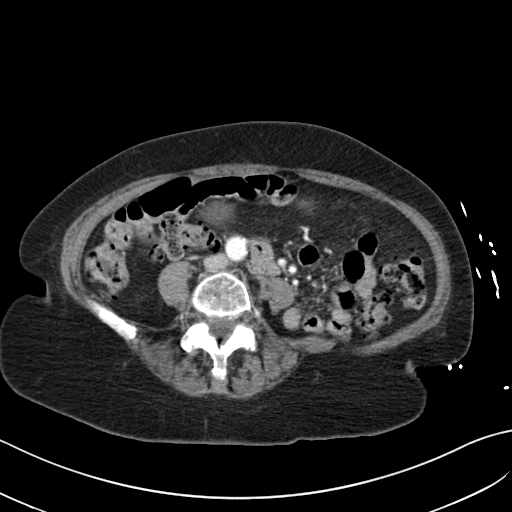
[im 45/72  soft-tissue]
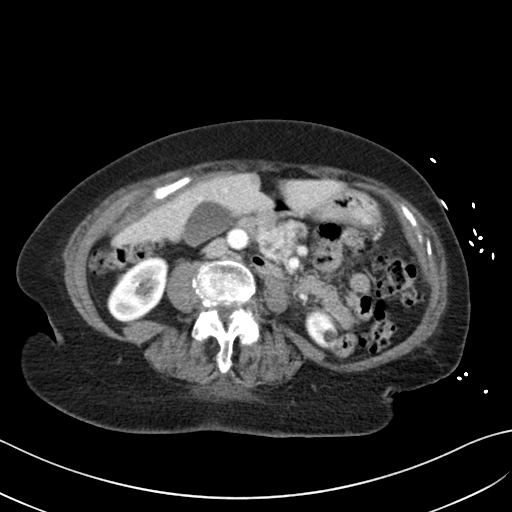
[im 45/72  bone]
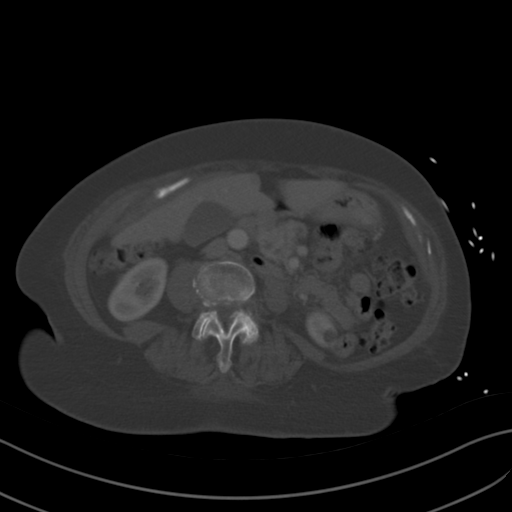
[im 49/72  soft-tissue]
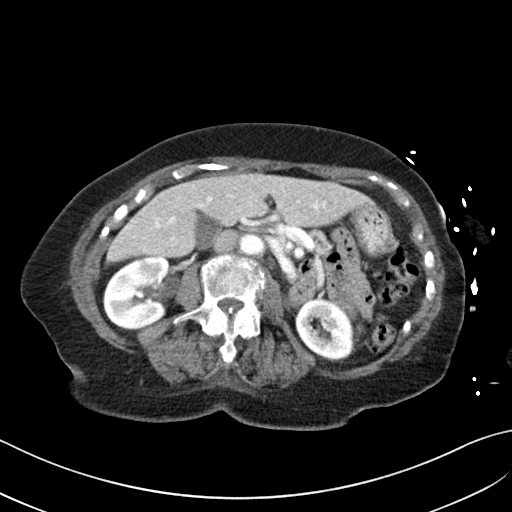
[im 58/72  soft-tissue]
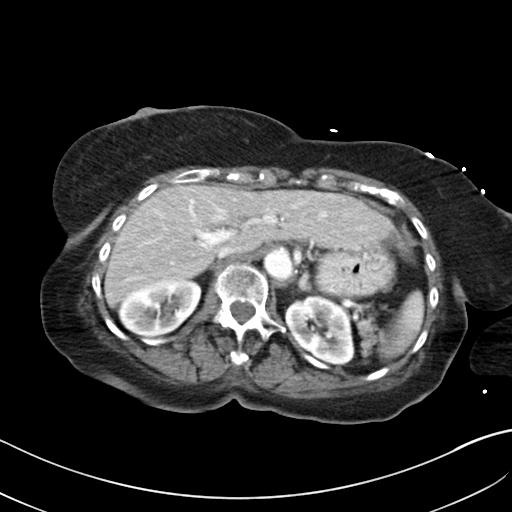
[im 63/72  soft-tissue]
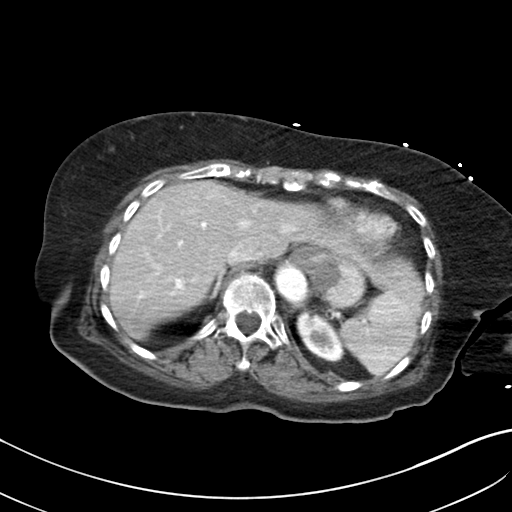
[im 67/72  soft-tissue]
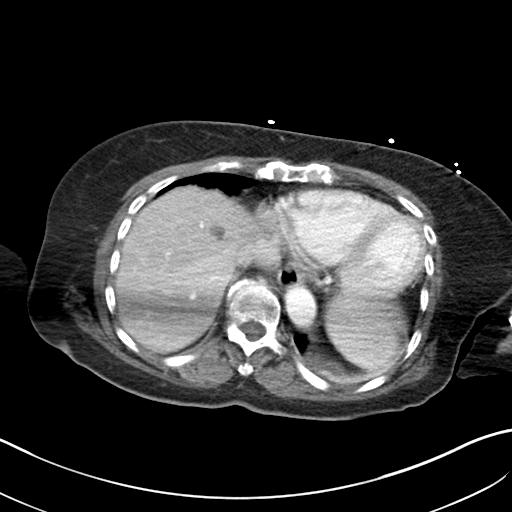

[Series 5: coronal st · coronal · 0.73mm/px · 3 of 77 slices shown]
[im 26/77  soft-tissue]
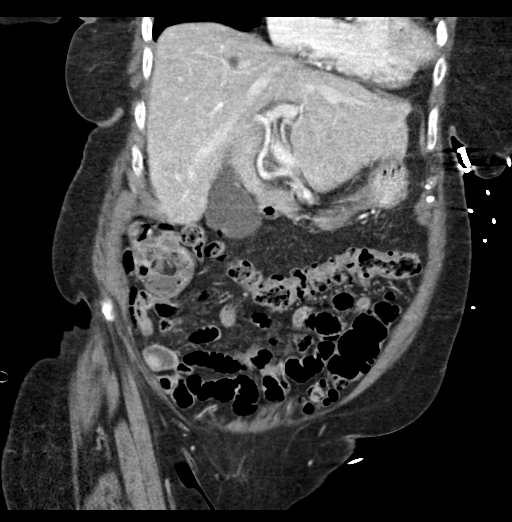
[im 34/77  soft-tissue]
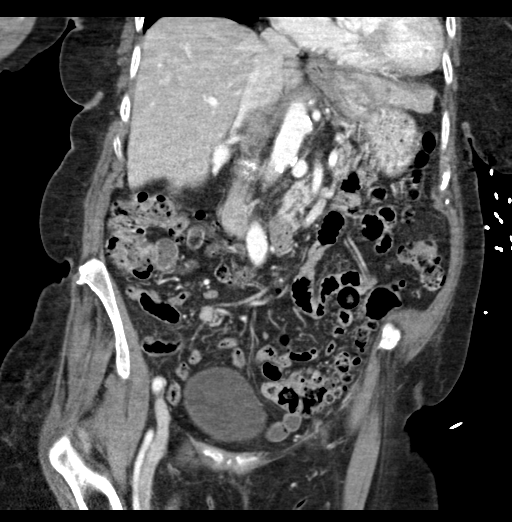
[im 43/77  soft-tissue]
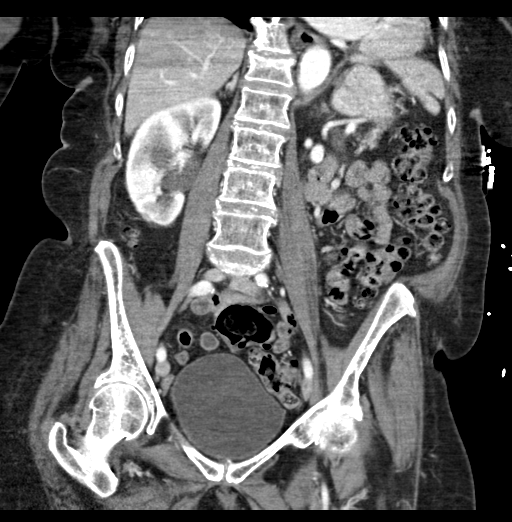

[16 of 46 positions shown; findings below may reference images not displayed]

FINDINGS: Lower chest: Atelectasis in both lung bases. Cardiac enlargement.

Hepatobiliary: Mild diffuse fatty infiltration of the liver. Cyst in
the dome of the liver. Gallbladder and bile ducts are unremarkable.

Pancreas: Unremarkable. No pancreatic ductal dilatation or
surrounding inflammatory changes.

Spleen: Normal in size without focal abnormality.

Adrenals/Urinary Tract: No adrenal gland nodules. Renal nephrograms
are symmetrical. Small cortical cysts in both kidneys. No
hydronephrosis or hydroureter. Bladder is unremarkable.

Stomach/Bowel: Stomach is within normal limits. Appendix appears
normal. No evidence of bowel wall thickening, distention, or
inflammatory changes. Stool throughout the colon. Diverticula in the
sigmoid colon without evidence of diverticulitis.

Vascular/Lymphatic: Aortic atherosclerosis. No enlarged abdominal or
pelvic lymph nodes.

Reproductive: Status post hysterectomy. No adnexal masses.

Other: No free air or free fluid in the abdomen. Periumbilical
hernia containing fat.

Musculoskeletal: Degenerative changes in the spine. Compression of
the superior endplate of T12. No change since previous study.
Degenerative changes in the hips.
IMPRESSION: 1. No acute process demonstrated in the abdomen or pelvis. No
evidence of bowel obstruction or inflammation. Stool throughout the
colon. Diverticulosis without evidence of diverticulitis.
2. Diffuse fatty infiltration of the liver. Benign-appearing cysts
in the dome of the liver.
3. Periumbilical hernia containing fat.
4. Degenerative changes in the spine and hips.

Aortic Atherosclerosis (UNE9C-10H.H).

## 2019-08-13 ENCOUNTER — Other Ambulatory Visit: Payer: Self-pay

## 2019-08-13 NOTE — Patient Outreach (Signed)
[facility]) Care Management  08/13/2019  [name] 1924-02-01 [id number]   Medication Adherence call to [name] HIPPA Compliant Voice message left with a call back number. [name] is showing past due on Glipizide 5 mg under [place].   [place] Management Direct Dial [phone]  Fax [phone] [email or web address]

## 2019-08-19 DIAGNOSIS — E1165 Type 2 diabetes mellitus with hyperglycemia: Secondary | ICD-10-CM | POA: Diagnosis not present

## 2019-08-19 DIAGNOSIS — I10 Essential (primary) hypertension: Secondary | ICD-10-CM | POA: Diagnosis not present

## 2019-08-19 DIAGNOSIS — I700 Atherosclerosis of aorta: Secondary | ICD-10-CM | POA: Diagnosis not present

## 2019-08-26 ENCOUNTER — Other Ambulatory Visit: Payer: Self-pay

## 2019-08-26 DIAGNOSIS — M150 Primary generalized (osteo)arthritis: Secondary | ICD-10-CM | POA: Diagnosis not present

## 2019-08-26 DIAGNOSIS — H6120 Impacted cerumen, unspecified ear: Secondary | ICD-10-CM | POA: Diagnosis not present

## 2019-08-26 DIAGNOSIS — I10 Essential (primary) hypertension: Secondary | ICD-10-CM | POA: Diagnosis not present

## 2019-08-26 DIAGNOSIS — I700 Atherosclerosis of aorta: Secondary | ICD-10-CM | POA: Diagnosis not present

## 2019-08-26 DIAGNOSIS — E1165 Type 2 diabetes mellitus with hyperglycemia: Secondary | ICD-10-CM | POA: Diagnosis not present

## 2019-08-26 NOTE — Patient Outreach (Signed)
[facility]) Care Management  08/26/2019  [name] 27-Jan-1924 [id number]   Medication Adherence call to [name] regarding Medication Adherence unable to reach patient,patient did not answer patient is past due on Glipizide 5 mg under [place].   [place] Management Direct Dial [phone]  Fax [phone] [email or web address]

## 2019-11-30 DIAGNOSIS — H401133 Primary open-angle glaucoma, bilateral, severe stage: Secondary | ICD-10-CM | POA: Diagnosis not present

## 2020-08-11 DIAGNOSIS — Z23 Encounter for immunization: Secondary | ICD-10-CM | POA: Diagnosis not present

## 2020-08-11 DIAGNOSIS — Z0000 Encounter for general adult medical examination without abnormal findings: Secondary | ICD-10-CM | POA: Diagnosis not present

## 2020-08-11 DIAGNOSIS — E1165 Type 2 diabetes mellitus with hyperglycemia: Secondary | ICD-10-CM | POA: Diagnosis not present

## 2020-08-11 DIAGNOSIS — I10 Essential (primary) hypertension: Secondary | ICD-10-CM | POA: Diagnosis not present

## 2020-12-05 ENCOUNTER — Emergency Department (HOSPITAL_COMMUNITY): Payer: Medicare Other

## 2020-12-05 ENCOUNTER — Inpatient Hospital Stay (HOSPITAL_COMMUNITY)
Admission: EM | Admit: 2020-12-05 | Discharge: 2020-12-09 | DRG: 177 | Disposition: A | Discharge status: Home / Self Care | Payer: Medicare Other | Attending: Internal Medicine | Admitting: Internal Medicine

## 2020-12-05 ENCOUNTER — Encounter (HOSPITAL_COMMUNITY): Payer: Self-pay

## 2020-12-05 DIAGNOSIS — K219 Gastro-esophageal reflux disease without esophagitis: Secondary | ICD-10-CM

## 2020-12-05 DIAGNOSIS — Z743 Need for continuous supervision: Secondary | ICD-10-CM | POA: Diagnosis not present

## 2020-12-05 DIAGNOSIS — E860 Dehydration: Secondary | ICD-10-CM

## 2020-12-05 DIAGNOSIS — R059 Cough, unspecified: Secondary | ICD-10-CM | POA: Diagnosis not present

## 2020-12-05 DIAGNOSIS — Z79899 Other long term (current) drug therapy: Secondary | ICD-10-CM

## 2020-12-05 DIAGNOSIS — I10 Essential (primary) hypertension: Secondary | ICD-10-CM | POA: Diagnosis present

## 2020-12-05 DIAGNOSIS — Z882 Allergy status to sulfonamides status: Secondary | ICD-10-CM | POA: Diagnosis not present

## 2020-12-05 DIAGNOSIS — Z7984 Long term (current) use of oral hypoglycemic drugs: Secondary | ICD-10-CM

## 2020-12-05 DIAGNOSIS — F0390 Unspecified dementia without behavioral disturbance: Secondary | ICD-10-CM | POA: Diagnosis present

## 2020-12-05 DIAGNOSIS — K5900 Constipation, unspecified: Secondary | ICD-10-CM | POA: Diagnosis not present

## 2020-12-05 DIAGNOSIS — G9341 Metabolic encephalopathy: Secondary | ICD-10-CM | POA: Diagnosis not present

## 2020-12-05 DIAGNOSIS — L89152 Pressure ulcer of sacral region, stage 2: Secondary | ICD-10-CM | POA: Diagnosis not present

## 2020-12-05 DIAGNOSIS — E1165 Type 2 diabetes mellitus with hyperglycemia: Secondary | ICD-10-CM | POA: Diagnosis not present

## 2020-12-05 DIAGNOSIS — R404 Transient alteration of awareness: Secondary | ICD-10-CM | POA: Diagnosis not present

## 2020-12-05 DIAGNOSIS — Z888 Allergy status to other drugs, medicaments and biological substances status: Secondary | ICD-10-CM | POA: Diagnosis not present

## 2020-12-05 DIAGNOSIS — L89153 Pressure ulcer of sacral region, stage 3: Secondary | ICD-10-CM | POA: Diagnosis not present

## 2020-12-05 DIAGNOSIS — R627 Adult failure to thrive: Secondary | ICD-10-CM | POA: Diagnosis present

## 2020-12-05 DIAGNOSIS — L89312 Pressure ulcer of right buttock, stage 2: Secondary | ICD-10-CM | POA: Diagnosis not present

## 2020-12-05 DIAGNOSIS — U071 COVID-19: Principal | ICD-10-CM

## 2020-12-05 DIAGNOSIS — H409 Unspecified glaucoma: Secondary | ICD-10-CM | POA: Diagnosis not present

## 2020-12-05 DIAGNOSIS — Z6825 Body mass index (BMI) 25.0-25.9, adult: Secondary | ICD-10-CM

## 2020-12-05 DIAGNOSIS — E162 Hypoglycemia, unspecified: Secondary | ICD-10-CM

## 2020-12-05 DIAGNOSIS — L89309 Pressure ulcer of unspecified buttock, unspecified stage: Secondary | ICD-10-CM | POA: Diagnosis present

## 2020-12-05 DIAGNOSIS — R279 Unspecified lack of coordination: Secondary | ICD-10-CM | POA: Diagnosis not present

## 2020-12-05 DIAGNOSIS — E11649 Type 2 diabetes mellitus with hypoglycemia without coma: Secondary | ICD-10-CM | POA: Diagnosis not present

## 2020-12-05 DIAGNOSIS — R6889 Other general symptoms and signs: Secondary | ICD-10-CM | POA: Diagnosis not present

## 2020-12-05 HISTORY — DX: Unspecified osteoarthritis, unspecified site: M19.90

## 2020-12-05 HISTORY — DX: Gastro-esophageal reflux disease without esophagitis: K21.9

## 2020-12-05 LAB — CBC WITH DIFFERENTIAL/PLATELET
Abs Immature Granulocytes: 0.01 10*3/uL (ref 0.00–0.07)
Basophils Absolute: 0 10*3/uL (ref 0.0–0.1)
Basophils Relative: 0 %
Eosinophils Absolute: 0 10*3/uL (ref 0.0–0.5)
Eosinophils Relative: 1 %
HCT: 33.8 % — ABNORMAL LOW (ref 36.0–46.0)
Hemoglobin: 10.2 g/dL — ABNORMAL LOW (ref 12.0–15.0)
Immature Granulocytes: 0 %
Lymphocytes Relative: 26 %
Lymphs Abs: 1.1 10*3/uL (ref 0.7–4.0)
MCH: 25.7 pg — ABNORMAL LOW (ref 26.0–34.0)
MCHC: 30.2 g/dL (ref 30.0–36.0)
MCV: 85.1 fL (ref 80.0–100.0)
Monocytes Absolute: 0.6 10*3/uL (ref 0.1–1.0)
Monocytes Relative: 14 %
Neutro Abs: 2.6 10*3/uL (ref 1.7–7.7)
Neutrophils Relative %: 59 %
Platelets: 250 10*3/uL (ref 150–400)
RBC: 3.97 MIL/uL (ref 3.87–5.11)
RDW: 14.6 % (ref 11.5–15.5)
WBC: 4.3 10*3/uL (ref 4.0–10.5)
nRBC: 0 % (ref 0.0–0.2)

## 2020-12-05 LAB — COMPREHENSIVE METABOLIC PANEL
ALT: 14 U/L (ref 0–44)
AST: 20 U/L (ref 15–41)
Albumin: 3.2 g/dL — ABNORMAL LOW (ref 3.5–5.0)
Alkaline Phosphatase: 62 U/L (ref 38–126)
Anion gap: 11 (ref 5–15)
BUN: 16 mg/dL (ref 8–23)
CO2: 26 mmol/L (ref 22–32)
Calcium: 8.6 mg/dL — ABNORMAL LOW (ref 8.9–10.3)
Chloride: 106 mmol/L (ref 98–111)
Creatinine, Ser: 0.61 mg/dL (ref 0.44–1.00)
GFR, Estimated: >60 mL/min (ref >60)
Glucose, Bld: 134 mg/dL — ABNORMAL HIGH (ref 70–99)
Potassium: 3.6 mmol/L (ref 3.5–5.1)
Sodium: 143 mmol/L (ref 135–145)
Total Bilirubin: 0.8 mg/dL (ref 0.3–1.2)
Total Protein: 7.2 g/dL (ref 6.5–8.1)

## 2020-12-05 LAB — RESP PANEL BY RT-PCR (FLU A&B, COVID) ARPGX2
Influenza A by PCR: NEGATIVE
Influenza B by PCR: NEGATIVE
SARS Coronavirus 2 by RT PCR: POSITIVE — AB

## 2020-12-05 MED ORDER — GUAIFENESIN-DM 100-10 MG/5ML PO SYRP: 10.0000 mL | ORAL_SOLUTION | ORAL | Status: DC | PRN

## 2020-12-05 MED ORDER — MEMANTINE HCL-DONEPEZIL HCL ER 28-10 MG PO CP24: 1.0000 | ORAL_CAPSULE | Freq: Every day | ORAL | Status: DC

## 2020-12-05 MED ORDER — ENOXAPARIN SODIUM 40 MG/0.4ML ~~LOC~~ SOLN
40.0000 mg | SUBCUTANEOUS | Status: DC
  Administered 2020-12-06 – 2020-12-09 (×4): 40 mg via SUBCUTANEOUS
  Filled 2020-12-05 (×4): qty 0.4

## 2020-12-05 MED ORDER — LACTATED RINGERS IV SOLN: INTRAVENOUS | Status: AC

## 2020-12-05 MED ORDER — ACETAMINOPHEN 325 MG PO TABS: 650.0000 mg | ORAL_TABLET | Freq: Four times a day (QID) | ORAL | Status: DC | PRN

## 2020-12-05 MED ORDER — TRAVOPROST 0.004 % OP SOLN
1.0000 [drp] | Freq: Every day | OPHTHALMIC | Status: DC
  Administered 2020-12-06: 1 [drp] via OPHTHALMIC

## 2020-12-05 MED ORDER — DIVALPROEX SODIUM 250 MG PO DR TAB
250.0000 mg | DELAYED_RELEASE_TABLET | Freq: Two times a day (BID) | ORAL | Status: DC
  Administered 2020-12-06 – 2020-12-09 (×9): 250 mg via ORAL
  Filled 2020-12-05 (×8): qty 1

## 2020-12-05 MED ORDER — ONDANSETRON HCL 4 MG PO TABS: 4.0000 mg | ORAL_TABLET | Freq: Four times a day (QID) | ORAL | Status: DC | PRN

## 2020-12-05 MED ORDER — FLEET ENEMA 7-19 GM/118ML RE ENEM
1.0000 | ENEMA | Freq: Once | RECTAL | Status: AC
  Administered 2020-12-05: 1 via RECTAL
  Filled 2020-12-05: qty 1

## 2020-12-05 MED ORDER — DORZOLAMIDE HCL-TIMOLOL MAL 2-0.5 % OP SOLN
1.0000 [drp] | Freq: Three times a day (TID) | OPHTHALMIC | Status: DC
  Administered 2020-12-06 – 2020-12-09 (×10): 1 [drp] via OPHTHALMIC
  Filled 2020-12-05: qty 10

## 2020-12-05 MED ORDER — ALBUTEROL SULFATE HFA 108 (90 BASE) MCG/ACT IN AERS: 2.0000 | INHALATION_SPRAY | RESPIRATORY_TRACT | Status: DC | PRN

## 2020-12-05 MED ORDER — SODIUM CHLORIDE 0.9 % IV SOLN
100.0000 mg | Freq: Every day | INTRAVENOUS | Status: DC
  Administered 2020-12-06 – 2020-12-07 (×2): 100 mg via INTRAVENOUS
  Filled 2020-12-05 (×2): qty 20

## 2020-12-05 MED ORDER — INSULIN ASPART 100 UNIT/ML ~~LOC~~ SOLN
0.0000 [IU] | Freq: Three times a day (TID) | SUBCUTANEOUS | Status: DC
  Administered 2020-12-06: 2 [IU] via SUBCUTANEOUS
  Administered 2020-12-06 – 2020-12-07 (×3): 3 [IU] via SUBCUTANEOUS
  Administered 2020-12-08: 2 [IU] via SUBCUTANEOUS
  Filled 2020-12-05: qty 0.15

## 2020-12-05 MED ORDER — ASCORBIC ACID 500 MG PO TABS
500.0000 mg | ORAL_TABLET | Freq: Every day | ORAL | Status: DC
  Administered 2020-12-06 – 2020-12-09 (×5): 500 mg via ORAL
  Filled 2020-12-05 (×4): qty 1

## 2020-12-05 MED ORDER — ONDANSETRON HCL 4 MG/2ML IJ SOLN: 4.0000 mg | Freq: Four times a day (QID) | INTRAMUSCULAR | Status: DC | PRN

## 2020-12-05 MED ORDER — POLYETHYLENE GLYCOL 3350 17 G PO PACK: 17.0000 g | PACK | Freq: Every day | ORAL | Status: DC | PRN

## 2020-12-05 MED ORDER — ZINC SULFATE 220 (50 ZN) MG PO CAPS
220.0000 mg | ORAL_CAPSULE | Freq: Every day | ORAL | Status: DC
  Administered 2020-12-06 – 2020-12-09 (×5): 220 mg via ORAL
  Filled 2020-12-05 (×4): qty 1

## 2020-12-05 MED ORDER — SODIUM CHLORIDE 0.9 % IV SOLN
200.0000 mg | Freq: Once | INTRAVENOUS | Status: AC
  Administered 2020-12-06: 200 mg via INTRAVENOUS
  Filled 2020-12-05: qty 200

## 2020-12-05 NOTE — ED Provider Notes (Signed)
[facility] DEPT Provider Note   CSN: [id number] Arrival date & time: 12/05/20  1757     History Chief Complaint  Patient presents with  . Altered Mental Status  . Constipation    [name] is a 87 y.o. female.  The history is provided by the patient. No language interpreter was used.  Altered Mental Status Presenting symptoms: confusion   Severity:  Moderate Most recent episode:  More than 2 days ago Episode history:  Unable to specify Timing:  Constant Progression:  Worsening Chronicity:  New Context: recent illness   Associated symptoms: no abdominal pain   Constipation Associated symptoms: no abdominal pain    Pt's son reports pt has had increased confusion.  He is worried that pt has had a stroke. Pt was eating on her own and walking until recently.  Pt has not been eating or drinking normally  He also reports pt has a cough and she has a sore starting on her buttock.  Son reports pt has not had a bowel movement in over a week.  Pt straining and trying to have BM    Past Medical History:  Diagnosis Date  . Dementia ([place])   . Diabetes mellitus without complication ([place])   . Glaucoma   . Hypertension     Patient Active Problem List   Diagnosis Date Noted  . Cough   . Allergic rhinitis   . Gastroesophageal reflux disease   . Hypertension 09/12/2017  . Diabetes mellitus without complication ([place]) 86/76/1950  . Dementia ([place]) 09/12/2017  . Hyponatremia 09/12/2017  . 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 TIA (transient ischemic attack) 09/12/2017    No past surgical history on file.   OB History   No obstetric history on file.     No family history on file.  Social History   Tobacco Use  . Smoking status: Never Smoker  . Smokeless tobacco: Never Used  Substance Use Topics  . Alcohol use: No  . Drug use: No    Home Medications Prior to Admission medications   Medication Sig Start Date End Date  Taking? Authorizing Provider  acetaminophen (TYLENOL) 650 MG CR tablet Take 650 mg by mouth daily as needed for pain.    [provider]  amLODipine (NORVASC) 5 MG tablet Take 5 mg by mouth daily.    [provider]  aspirin 325 MG tablet Take 1 tablet (325 mg total) by mouth daily. Patient not taking: Reported on 08/03/2018 09/13/17   Barton Dubois, MD  ASPIRIN 81 PO Take by mouth.    [provider]  celecoxib (CELEBREX) 200 MG capsule Take 200 mg by mouth daily. 07/01/18   [provider]  clotrimazole-betamethasone (LOTRISONE) cream Apply 1 application topically daily. 07/01/18   [provider]  divalproex (DEPAKOTE) 250 MG DR tablet Take 250 mg by mouth 2 (two) times daily.    [provider]  dorzolamide-timolol (COSOPT) 22.3-6.8 MG/ML ophthalmic solution Place 1 drop into both eyes 3 (three) times daily. 07/19/18   [provider]  fluticasone (FLONASE) 50 MCG/ACT nasal spray Place 1 spray into both nostrils daily. 09/13/17   Barton Dubois, MD  glipiZIDE (GLUCOTROL) 5 MG tablet Take 5 mg by mouth daily before breakfast.    [provider]  guaiFENesin (ROBITUSSIN) 100 MG/5ML SOLN Take 10 mLs (200 mg total) by mouth every 8 (eight) hours as needed for cough. Patient not taking: Reported on 08/03/2018 09/13/17   Barton Dubois,  MD  HYDROcodone-acetaminophen (NORCO/VICODIN) 5-325 MG tablet Take 1 tablet by mouth every 4 (four) hours as needed. 08/04/18   Larene Pickett, PA-C  loratadine (CLARITIN) 10 MG tablet Take 1 tablet (10 mg total) by mouth daily. 09/13/17   Barton Dubois, MD  Memantine HCl-Donepezil HCl [facility]) 28-10 MG CP24 Take 1 capsule by mouth daily.    [provider]  naproxen (NAPROSYN) 500 MG tablet Take 1 tablet (500 mg total) by mouth 2 (two) times daily with a meal. 08/04/18   Baird Cancer, Vilinda Blanks, PA-C  pantoprazole (PROTONIX) 40 MG tablet Take 1 tablet (40 mg total) by mouth daily. 09/13/17  09/13/18  Barton Dubois, MD  promethazine-dextromethorphan (PROMETHAZINE-DM) 6.25-15 MG/5ML syrup Take 5 mLs by mouth every 6 (six) hours as needed for cough or congestion. 07/14/18   [provider]  TRAVATAN Z 0.004 % SOLN ophthalmic solution Place 1 drop into both eyes at bedtime. 06/29/18   [provider]  travoprost, benzalkonium, (TRAVATAN) 0.004 % ophthalmic solution Place 1 drop into both eyes at bedtime.    [provider]    Allergies    Patient has no known allergies.  Review of Systems   Review of Systems  Unable to perform ROS: Dementia  Gastrointestinal: Positive for constipation. Negative for abdominal pain.  Psychiatric/Behavioral: Positive for confusion.  All other systems reviewed and are negative.   Physical Exam Updated Vital Signs BP (!) 179/71 (BP Location: Left Arm)   Pulse 62   Temp 98.5 F (36.9 C) (Oral)   Resp (!) 23   SpO2 100%   Physical Exam Vitals and nursing note reviewed.  Constitutional:      Appearance: She is well-developed and well-nourished.  HENT:     Head: Normocephalic.  Eyes:     Extraocular Movements: EOM normal.     Pupils: Pupils are equal, round, and reactive to light.  Cardiovascular:     Rate and Rhythm: Normal rate.  Pulmonary:     Effort: Pulmonary effort is normal.  Abdominal:     General: Abdomen is flat. There is no distension.  Musculoskeletal:        General: Normal range of motion.     Cervical back: Normal range of motion.  Skin:    General: Skin is warm.  Neurological:     General: No focal deficit present.     Mental Status: She is alert and oriented to person, place, and time.  Psychiatric:        Mood and Affect: Mood and affect and mood normal.     ED Results / Procedures / Treatments   Labs (all labs ordered are listed, but only abnormal results are displayed) Labs Reviewed  CBC WITH DIFFERENTIAL/PLATELET - Abnormal; Notable for the following components:      Result  Value   Hemoglobin 10.2 (*)    HCT 33.8 (*)    MCH 25.7 (*)    All other components within normal limits  COMPREHENSIVE METABOLIC PANEL - Abnormal; Notable for the following components:   Glucose, Bld 134 (*)    Calcium 8.6 (*)    Albumin 3.2 (*)    All other components within normal limits  RESP PANEL BY RT-PCR (FLU A&B, COVID) ARPGX2  URINALYSIS, ROUTINE W REFLEX MICROSCOPIC    EKG None  Radiology DG Abdomen 1 View  Result Date: 12/05/2020 CLINICAL DATA:  Constipation. EXAM: ABDOMEN - 1 VIEW COMPARISON:  Abdominal CT 08/03/2018 FINDINGS: No bowel dilatation to suggest obstruction. No evidence  of free air on this portable supine view. Moderate stool in the distal transverse, descending, and sigmoid colon. Mild to moderate rectal distention with stool. Small volume of stool in the more proximal colon. No radiopaque calculi or abnormal soft tissue calcifications. Bones are diffusely under mineralized. IMPRESSION: Moderate colonic stool burden with mild to moderate rectal distention. No bowel obstruction. Electronically Signed   By: Keith Rake M.D.   On: 12/05/2020 19:39   CT Head Wo Contrast  Result Date: 12/05/2020 CLINICAL DATA:  Altered mental status. EXAM: CT HEAD WITHOUT CONTRAST TECHNIQUE: Contiguous axial images were obtained from the base of the skull through the vertex without intravenous contrast. COMPARISON:  September 12, 2017 FINDINGS: Brain: There is moderate severity cerebral atrophy with widening of the extra-axial spaces and ventricular dilatation. Extra-axial space widening is most prominent within the bilateral frontal regions and is unchanged in appearance when compared to the prior exam. There are areas of decreased attenuation within the white matter tracts of the supratentorial brain, consistent with microvascular disease changes. Vascular: No hyperdense vessel or unexpected calcification. Skull: Normal. Negative for fracture or focal lesion. Sinuses/Orbits: No  acute finding. Other: None. IMPRESSION: 1. Generalized cerebral atrophy. 2. No acute intracranial abnormality. Electronically Signed   By: Virgina Norfolk M.D.   On: 12/05/2020 20:38   DG Chest Port 1 View  Result Date: 12/05/2020 CLINICAL DATA:  Cough. EXAM: PORTABLE CHEST 1 VIEW COMPARISON:  08/03/2018 FINDINGS: Upper normal heart size.The cardiomediastinal contours are unchanged. Aortic atherosclerosis. Pulmonary vasculature is normal. No consolidation, pleural effusion, or pneumothorax. No acute osseous abnormalities are seen. Thoracic spondylosis. IMPRESSION: No acute abnormality. Electronically Signed   By: Keith Rake M.D.   On: 12/05/2020 19:38    Procedures Procedures   Medications Ordered in ED Medications - No data to display  ED Course  I have reviewed the triage vital signs and the nursing notes.  Pertinent labs & imaging results that were available during my care of the patient were reviewed by me and considered in my medical decision making (see chart for details).    MDM Rules/Calculators/A&P                          MDM: Pt given enema, wound care ordered.   Covid returned and is positive.   Hospitalist consulted for admission  Final Clinical Impression(s) / ED Diagnoses Final diagnoses:  COVID  Dehydration  Constipation, unspecified constipation type  Pressure injury of sacral region, stage 2 [facility])    Rx / DC Orders ED Discharge Orders    None       Sidney Ace 12/05/20 2222    Quintella Reichert, MD 12/05/20 2229

## 2020-12-05 NOTE — H&P (Signed)
History and Physical    [name] XNA:[id number] DOB: 02/09/1924 DOA: 12/05/2020  PCP: Traci Seashore, MD  Patient coming from: Home   Chief Complaint:  Chief Complaint  Patient presents with  . Altered Mental Status  . Constipation     HPI:    87 year old female with past medical history of dementia, hypertension, diabetes mellitus type 2, gastroesophageal reflux disease who presents to [facility] emergency department with family after they report a 1 week history of progressively worsening constipation, decreasing oral intake and constipation.  Patient is an extremely poor historian and is unable to provide history due to advanced dementia.  Majority of the history is been obtained from the son who is sitting at the bedside.  Son explains that for approximate the past 1 week patient has been exhibiting progressively worsening confusion and lethargy.  This has been associated with a dry cough.    Son denies any recent fevers, nausea, vomiting or diarrhea.  Son denies any recent travel or contact with confirmed individuals with COVID-19.  Due to progressive worsening symptoms the patient was eventually brought in to [facility] emergency department for evaluation.  Upon evaluation in the emergency department patient was found to be Covid PCR positive.  Patient was found to be clinically dehydrated and extremely lethargic concerning for metabolic encephalopathy secondary to COVID-19 infection.  An enema was provided for the patient's constipation.  Patient was initiated on intravenous fluids and hospitalist group was then called to assess the patient for admission of the hospital.  Review of Systems:   Review of Systems  Unable to perform ROS: Mental status change    Past Medical History:  Diagnosis Date  . Dementia ([place])   . Diabetes mellitus without complication ([place])   . Glaucoma   . Hypertension     History reviewed. No pertinent surgical  history.   reports that she has never smoked. She has never used smokeless tobacco. She reports that she does not drink alcohol and does not use drugs.  Allergies  Allergen Reactions  . Lisinopril     Other reaction(s): cough  . Prochlorperazine     Other reaction(s): Unknown  . Sulfamethoxazole-Trimethoprim     Other reaction(s): rash    Family History  Problem Relation Age of Onset  . Heart attack Neg Hx   . Stroke Neg Hx      Prior to Admission medications   Medication Sig Start Date End Date Taking? Authorizing Provider  acetaminophen (TYLENOL) 650 MG CR tablet Take 650 mg by mouth daily as needed for pain.   Yes [provider]  brompheniramine-pseudoephedrine-DM 30-2-10 MG/5ML syrup Take 5 mLs by mouth daily as needed (cough). 10/31/20  Yes [provider]  celecoxib (CELEBREX) 200 MG capsule Take 200 mg by mouth daily. 07/01/18  Yes [provider]  divalproex (DEPAKOTE) 250 MG DR tablet Take 250 mg by mouth 2 (two) times daily.   Yes [provider]  dorzolamide-timolol (COSOPT) 22.3-6.8 MG/ML ophthalmic solution Place 1 drop into both eyes 3 (three) times daily. 07/19/18  Yes [provider]  glipiZIDE (GLUCOTROL) 5 MG tablet Take 5 mg by mouth daily before breakfast.   Yes [provider]  Memantine HCl-Donepezil HCl 28-10 MG CP24 Take 1 capsule by mouth daily.   Yes [provider]  travoprost, benzalkonium, (TRAVATAN) 0.004 % ophthalmic solution Place 1 drop into both eyes at bedtime.   Yes [provider]    Physical Exam: Vitals:  12/05/20 1819  BP: (!) 179/71  Pulse: 62  Resp: (!) 23  Temp: 98.5 F (36.9 C)  TempSrc: Oral  SpO2: 100%    Constitutional: Acute alert and oriented x1, no associated distress.  Patient is hard of hearing. Skin: 4 cm diameter stage III sacral pressure wound noted.  No rashes, no lesions, extremely poor skin turgor noted. Eyes: Pupils are equally reactive to  light.  No evidence of scleral icterus or conjunctival pallor.  ENMT: Extremely dryt mucous membranes noted.  Posterior pharynx clear of any exudate or lesions.   Neck: normal, supple, no masses, no thyromegaly.  No evidence of jugular venous distension.   Respiratory: Bilateral mid and lower field rales.  Normal respiratory effort. No accessory muscle use.  Cardiovascular: Regular rate and rhythm, no murmurs / rubs / gallops. No extremity edema. 2+ pedal pulses. No carotid bruits.  Chest:   Nontender without crepitus or deformity.   Back:   Nontender without crepitus or deformity. Abdomen: Abdomen is soft and nontender.  No evidence of intra-abdominal masses.  Positive bowel sounds noted in all quadrants.   Musculoskeletal: No joint deformity upper and lower extremities. Good ROM, no contractures.  Poor muscle tone noted. Neurologic: Patient is extremely hard of hearing.  Patient is only intermittently following commands.  Patient is lethargic but arousable and disoriented.  Patient is moving all 4 extremities spontaneously.  Patient is responsive to verbal and painful stimuli.  Psychiatric: Patient exhibits normal mood with appropriate affect.  Patient seems to possess insight as to their current situation.     Labs on Admission: I have personally reviewed following labs and imaging studies -   CBC: Recent Labs  Lab 12/05/20 1907  WBC 4.3  NEUTROABS 2.6  HGB 10.2*  HCT 33.8*  MCV 85.1  PLT 694   Basic Metabolic Panel: Recent Labs  Lab 12/05/20 1907  NA 143  K 3.6  CL 106  CO2 26  GLUCOSE 134*  BUN 16  CREATININE 0.61  CALCIUM 8.6*   GFR: CrCl cannot be calculated (Unknown ideal weight.). Liver Function Tests: Recent Labs  Lab 12/05/20 1907  AST 20  ALT 14  ALKPHOS 62  BILITOT 0.8  PROT 7.2  ALBUMIN 3.2*   No results for input(s): LIPASE, AMYLASE in the last 168 hours. No results for input(s): AMMONIA in the last 168 hours. Coagulation Profile: No results for  input(s): INR, PROTIME in the last 168 hours. Cardiac Enzymes: No results for input(s): CKTOTAL, CKMB, CKMBINDEX, TROPONINI in the last 168 hours. BNP (last 3 results) No results for input(s): PROBNP in the last 8760 hours. HbA1C: No results for input(s): HGBA1C in the last 72 hours. CBG: No results for input(s): GLUCAP in the last 168 hours. Lipid Profile: No results for input(s): CHOL, HDL, LDLCALC, TRIG, CHOLHDL, LDLDIRECT in the last 72 hours. Thyroid Function Tests: No results for input(s): TSH, T4TOTAL, FREET4, T3FREE, THYROIDAB in the last 72 hours. Anemia Panel: No results for input(s): VITAMINB12, FOLATE, FERRITIN, TIBC, IRON, RETICCTPCT in the last 72 hours. Urine analysis:    Component Value Date/Time   COLORURINE YELLOW 08/04/2018 0220   APPEARANCEUR CLEAR 08/04/2018 0220   LABSPEC 1.030 08/04/2018 0220   PHURINE 7.0 08/04/2018 0220   GLUCOSEU NEGATIVE 08/04/2018 0220   HGBUR SMALL (A) 08/04/2018 0220   BILIRUBINUR NEGATIVE 08/04/2018 0220   KETONESUR 20 (A) 08/04/2018 0220   PROTEINUR NEGATIVE 08/04/2018 0220   UROBILINOGEN 1.0 03/22/2011 1714   NITRITE NEGATIVE 08/04/2018 0220  LEUKOCYTESUR NEGATIVE 08/04/2018 0220    Radiological Exams on Admission - Personally Reviewed: DG Abdomen 1 View  Result Date: 12/05/2020 CLINICAL DATA:  Constipation. EXAM: ABDOMEN - 1 VIEW COMPARISON:  Abdominal CT 08/03/2018 FINDINGS: No bowel dilatation to suggest obstruction. No evidence of free air on this portable supine view. Moderate stool in the distal transverse, descending, and sigmoid colon. Mild to moderate rectal distention with stool. Small volume of stool in the more proximal colon. No radiopaque calculi or abnormal soft tissue calcifications. Bones are diffusely under mineralized. IMPRESSION: Moderate colonic stool burden with mild to moderate rectal distention. No bowel obstruction. Electronically Signed   By: Keith Rake M.D.   On: 12/05/2020 19:39   CT Head Wo  Contrast  Result Date: 12/05/2020 CLINICAL DATA:  Altered mental status. EXAM: CT HEAD WITHOUT CONTRAST TECHNIQUE: Contiguous axial images were obtained from the base of the skull through the vertex without intravenous contrast. COMPARISON:  September 12, 2017 FINDINGS: Brain: There is moderate severity cerebral atrophy with widening of the extra-axial spaces and ventricular dilatation. Extra-axial space widening is most prominent within the bilateral frontal regions and is unchanged in appearance when compared to the prior exam. There are areas of decreased attenuation within the white matter tracts of the supratentorial brain, consistent with microvascular disease changes. Vascular: No hyperdense vessel or unexpected calcification. Skull: Normal. Negative for fracture or focal lesion. Sinuses/Orbits: No acute finding. Other: None. IMPRESSION: 1. Generalized cerebral atrophy. 2. No acute intracranial abnormality. Electronically Signed   By: Virgina Norfolk M.D.   On: 12/05/2020 20:38   DG Chest Port 1 View  Result Date: 12/05/2020 CLINICAL DATA:  Cough. EXAM: PORTABLE CHEST 1 VIEW COMPARISON:  08/03/2018 FINDINGS: Upper normal heart size.The cardiomediastinal contours are unchanged. Aortic atherosclerosis. Pulmonary vasculature is normal. No consolidation, pleural effusion, or pneumothorax. No acute osseous abnormalities are seen. Thoracic spondylosis. IMPRESSION: No acute abnormality. Electronically Signed   By: Keith Rake M.D.   On: 12/05/2020 19:38    EKG: Personally reviewed.  Rhythm is normal sinus rhythm with heart rate of 62 bpm.  I disagree with the computer I do not believe the patient is in atrial flutter.  No dynamic ST segment changes appreciated.  Assessment/Plan Principal Problem:   COVID-19 virus infection  Patient is exhibiting a 1 week history of progressively worsening lethargy confusion and dry cough with evidence of significant volume depletion  Etiology of patient's  symptoms is likely underlying Covid infection as patient has tested positive here  Initiating airborne isolation precautions, family has been educated  Patient is not exhibiting any evidence of associated hypoxia or respiratory symptoms  Therefore, patient likely would benefit from a 3-day course of remdesivir  Steroids are not indicated at this time but will be reconsidered if patient becomes hypoxic  Bronchodilators for bouts of wheezing  As needed antitussives for cough  Zinc and vitamin C daily  Active Problems:   Acute metabolic encephalopathy   Acute metabolic encephalopathy felt to be secondary to volume depletion and COVID-19 infection  Hydrating patient gently with intravenous isotonic fluids  Treating underlying COVID-19 infection  Monitoring for resolution of patient's encephalopathy  If patient does not clinically improve will expand work-up    Dementia without behavioral disturbance (HCC)   Baseline presence of dementia makes assessment difficult  Minimizing painful stimuli as much as possible  Fall precautions  Frequent redirection    Constipation   Patient presenting with constipation in the emergency department  Likely exacerbated by volume depletion  This has been relieved with administration of a [place] enema    Sacral decubitus ulcer, stage III ([place])  Stage II to stage III sacral decubitus ulcer noted on examination  Dressing wound currently but additionally requesting wound care consultation in the morning    Type 2 diabetes mellitus with hyperglycemia, without long-term current use of insulin ([place])  Accu-Cheks before every meal and nightly with sliding scale insulin  Hemoglobin A1c pending    Code Status:  Full code Family Communication: Plan of care has been discussed at length with son who is at the bedside.  Status is: Observation  The patient remains OBS appropriate and will d/c before 2 midnights.  Dispo: The patient is  from: Home              Anticipated d/c is to: Home              Anticipated d/c date is: 2 days              Patient currently is not medically stable to d/c.   Difficult to place patient No        Vernelle Emerald MD [facility] Pager [phone]  If 7PM-7AM, please contact night-coverage [email or web address] Use universal [place] password for that web site. If you do not have the password, please call the hospital operator.  12/05/2020, 10:52 PM

## 2020-12-05 NOTE — ED Notes (Signed)
Report called and given to nurse.  

## 2020-12-05 NOTE — ED Triage Notes (Signed)
Per EMS-history of dementia-family states she has been constipated for 1 week-altered more than "nroma"

## 2020-12-06 ENCOUNTER — Encounter (HOSPITAL_COMMUNITY): Payer: Self-pay | Admitting: Internal Medicine

## 2020-12-06 ENCOUNTER — Other Ambulatory Visit: Payer: Self-pay

## 2020-12-06 ENCOUNTER — Encounter (HOSPITAL_BASED_OUTPATIENT_CLINIC_OR_DEPARTMENT_OTHER): Payer: Medicare Other | Admitting: Physician Assistant

## 2020-12-06 DIAGNOSIS — Z882 Allergy status to sulfonamides status: Secondary | ICD-10-CM | POA: Diagnosis not present

## 2020-12-06 DIAGNOSIS — I10 Essential (primary) hypertension: Secondary | ICD-10-CM | POA: Diagnosis present

## 2020-12-06 DIAGNOSIS — Z79899 Other long term (current) drug therapy: Secondary | ICD-10-CM | POA: Diagnosis not present

## 2020-12-06 DIAGNOSIS — H409 Unspecified glaucoma: Secondary | ICD-10-CM | POA: Diagnosis present

## 2020-12-06 DIAGNOSIS — K5900 Constipation, unspecified: Secondary | ICD-10-CM | POA: Diagnosis present

## 2020-12-06 DIAGNOSIS — U071 COVID-19: Secondary | ICD-10-CM | POA: Diagnosis present

## 2020-12-06 DIAGNOSIS — G9341 Metabolic encephalopathy: Secondary | ICD-10-CM | POA: Diagnosis present

## 2020-12-06 DIAGNOSIS — E1165 Type 2 diabetes mellitus with hyperglycemia: Secondary | ICD-10-CM | POA: Diagnosis present

## 2020-12-06 DIAGNOSIS — Z888 Allergy status to other drugs, medicaments and biological substances status: Secondary | ICD-10-CM | POA: Diagnosis not present

## 2020-12-06 DIAGNOSIS — Z7984 Long term (current) use of oral hypoglycemic drugs: Secondary | ICD-10-CM | POA: Diagnosis not present

## 2020-12-06 DIAGNOSIS — E162 Hypoglycemia, unspecified: Secondary | ICD-10-CM

## 2020-12-06 DIAGNOSIS — Z6825 Body mass index (BMI) 25.0-25.9, adult: Secondary | ICD-10-CM | POA: Diagnosis not present

## 2020-12-06 DIAGNOSIS — K219 Gastro-esophageal reflux disease without esophagitis: Secondary | ICD-10-CM | POA: Diagnosis present

## 2020-12-06 DIAGNOSIS — F0390 Unspecified dementia without behavioral disturbance: Secondary | ICD-10-CM | POA: Diagnosis present

## 2020-12-06 DIAGNOSIS — L89312 Pressure ulcer of right buttock, stage 2: Secondary | ICD-10-CM | POA: Diagnosis present

## 2020-12-06 DIAGNOSIS — E860 Dehydration: Secondary | ICD-10-CM | POA: Diagnosis present

## 2020-12-06 DIAGNOSIS — L89153 Pressure ulcer of sacral region, stage 3: Secondary | ICD-10-CM | POA: Diagnosis present

## 2020-12-06 DIAGNOSIS — E11649 Type 2 diabetes mellitus with hypoglycemia without coma: Secondary | ICD-10-CM | POA: Diagnosis not present

## 2020-12-06 DIAGNOSIS — R627 Adult failure to thrive: Secondary | ICD-10-CM | POA: Diagnosis present

## 2020-12-06 LAB — COMPREHENSIVE METABOLIC PANEL
ALT: 11 U/L (ref 0–44)
AST: 17 U/L (ref 15–41)
Albumin: 3 g/dL — ABNORMAL LOW (ref 3.5–5.0)
Alkaline Phosphatase: 53 U/L (ref 38–126)
Anion gap: 12 (ref 5–15)
BUN: 15 mg/dL (ref 8–23)
CO2: 24 mmol/L (ref 22–32)
Calcium: 8.3 mg/dL — ABNORMAL LOW (ref 8.9–10.3)
Chloride: 105 mmol/L (ref 98–111)
Creatinine, Ser: 0.55 mg/dL (ref 0.44–1.00)
GFR, Estimated: >60 mL/min (ref >60)
Glucose, Bld: 141 mg/dL — ABNORMAL HIGH (ref 70–99)
Potassium: 3.7 mmol/L (ref 3.5–5.1)
Sodium: 141 mmol/L (ref 135–145)
Total Bilirubin: 0.8 mg/dL (ref 0.3–1.2)
Total Protein: 6.7 g/dL (ref 6.5–8.1)

## 2020-12-06 LAB — CBC WITH DIFFERENTIAL/PLATELET
Abs Immature Granulocytes: 0.03 10*3/uL (ref 0.00–0.07)
Basophils Absolute: 0 10*3/uL (ref 0.0–0.1)
Basophils Relative: 0 %
Eosinophils Absolute: 0 10*3/uL (ref 0.0–0.5)
Eosinophils Relative: 1 %
HCT: 33.1 % — ABNORMAL LOW (ref 36.0–46.0)
Hemoglobin: 9.9 g/dL — ABNORMAL LOW (ref 12.0–15.0)
Immature Granulocytes: 1 %
Lymphocytes Relative: 24 %
Lymphs Abs: 1 10*3/uL (ref 0.7–4.0)
MCH: 25.8 pg — ABNORMAL LOW (ref 26.0–34.0)
MCHC: 29.9 g/dL — ABNORMAL LOW (ref 30.0–36.0)
MCV: 86.2 fL (ref 80.0–100.0)
Monocytes Absolute: 0.5 10*3/uL (ref 0.1–1.0)
Monocytes Relative: 11 %
Neutro Abs: 2.7 10*3/uL (ref 1.7–7.7)
Neutrophils Relative %: 63 %
Platelets: 254 10*3/uL (ref 150–400)
RBC: 3.84 MIL/uL — ABNORMAL LOW (ref 3.87–5.11)
RDW: 14.7 % (ref 11.5–15.5)
WBC: 4.3 10*3/uL (ref 4.0–10.5)
nRBC: 0 % (ref 0.0–0.2)

## 2020-12-06 LAB — TRIGLYCERIDES: Triglycerides: 97 mg/dL (ref <150)

## 2020-12-06 LAB — FIBRINOGEN: Fibrinogen: 514 mg/dL — ABNORMAL HIGH (ref 210–475)

## 2020-12-06 LAB — LACTIC ACID, PLASMA
Lactic Acid, Venous: 1 mmol/L (ref 0.5–1.9)
Lactic Acid, Venous: 1 mmol/L (ref 0.5–1.9)

## 2020-12-06 LAB — PROCALCITONIN: Procalcitonin: 0.1 ng/mL

## 2020-12-06 LAB — FERRITIN: Ferritin: 54 ng/mL (ref 11–307)

## 2020-12-06 LAB — GLUCOSE, CAPILLARY
Glucose-Capillary: 57 mg/dL — ABNORMAL LOW (ref 70–99)
Glucose-Capillary: 174 mg/dL — ABNORMAL HIGH (ref 70–99)
Glucose-Capillary: 185 mg/dL — ABNORMAL HIGH (ref 70–99)
Glucose-Capillary: 139 mg/dL — ABNORMAL HIGH (ref 70–99)
Glucose-Capillary: 154 mg/dL — ABNORMAL HIGH (ref 70–99)

## 2020-12-06 LAB — LACTATE DEHYDROGENASE: LDH: 214 U/L — ABNORMAL HIGH (ref 98–192)

## 2020-12-06 LAB — D-DIMER, QUANTITATIVE
D-Dimer, Quant: 1.57 ug/mL-FEU — ABNORMAL HIGH (ref 0.00–0.50)
D-Dimer, Quant: 1.85 ug/mL-FEU — ABNORMAL HIGH (ref 0.00–0.50)

## 2020-12-06 LAB — MAGNESIUM: Magnesium: 1.9 mg/dL (ref 1.7–2.4)

## 2020-12-06 LAB — C-REACTIVE PROTEIN
CRP: 6.4 mg/dL — ABNORMAL HIGH (ref <1.0)
CRP: 6.3 mg/dL — ABNORMAL HIGH (ref <1.0)

## 2020-12-06 MED ORDER — DEXTROSE 50 % IV SOLN
12.5000 g | Freq: Once | INTRAVENOUS | Status: AC
  Administered 2020-12-06: 12.5 g via INTRAVENOUS
  Filled 2020-12-06: qty 50

## 2020-12-06 MED ORDER — LATANOPROST 0.005 % OP SOLN
1.0000 [drp] | Freq: Every day | OPHTHALMIC | Status: DC
  Administered 2020-12-06 – 2020-12-08 (×4): 1 [drp] via OPHTHALMIC
  Filled 2020-12-06: qty 2.5

## 2020-12-06 MED ORDER — SENNOSIDES-DOCUSATE SODIUM 8.6-50 MG PO TABS
1.0000 | ORAL_TABLET | Freq: Two times a day (BID) | ORAL | Status: DC
  Administered 2020-12-06 – 2020-12-09 (×7): 1 via ORAL
  Filled 2020-12-06 (×7): qty 1

## 2020-12-06 MED ORDER — DEXTROSE IN LACTATED RINGERS 5 % IV SOLN: INTRAVENOUS | Status: AC

## 2020-12-06 MED ORDER — MEMANTINE HCL ER 28 MG PO CP24
28.0000 mg | ORAL_CAPSULE | Freq: Every day | ORAL | Status: DC
  Administered 2020-12-06 – 2020-12-08 (×4): 28 mg via ORAL
  Filled 2020-12-06 (×4): qty 1

## 2020-12-06 MED ORDER — DONEPEZIL HCL 10 MG PO TABS
10.0000 mg | ORAL_TABLET | Freq: Every day | ORAL | Status: DC
  Administered 2020-12-06 – 2020-12-08 (×4): 10 mg via ORAL
  Filled 2020-12-06 (×4): qty 1

## 2020-12-06 MED ORDER — POLYETHYLENE GLYCOL 3350 17 G PO PACK
17.0000 g | PACK | Freq: Every day | ORAL | Status: DC
  Administered 2020-12-06 – 2020-12-09 (×4): 17 g via ORAL
  Filled 2020-12-06 (×4): qty 1

## 2020-12-06 NOTE — Assessment & Plan Note (Addendum)
-   patient symptoms include lethargy - etiology considered due to metabolic abnormalities from dehydration - mentation appears back to normal dementia baseline on 2/16

## 2020-12-06 NOTE — Assessment & Plan Note (Addendum)
-  Patient had 1 week history of reported symptoms including lethargy, confusion, dry cough -No hypoxia or respiratory symptoms.  Patient completed on 3-day course of remdesivir -No indication for steroids

## 2020-12-06 NOTE — Plan of Care (Signed)
  Problem: Education: Goal: Knowledge of General Education information will improve Description: Including pain rating scale, medication(s)/side effects and non-pharmacologic comfort measures Outcome: Progressing   Problem: Health Behavior/Discharge Planning: Goal: Ability to manage health-related needs will improve Outcome: Progressing   Problem: Clinical Measurements: Goal: Ability to maintain clinical measurements within normal limits will improve Outcome: Progressing Goal: Will remain free from infection Outcome: Progressing Goal: Diagnostic test results will improve Outcome: Progressing Goal: Respiratory complications will improve Outcome: Progressing Goal: Cardiovascular complication will be avoided Outcome: Progressing   Problem: Activity: Goal: Risk for activity intolerance will decrease Outcome: Progressing   Problem: Nutrition: Goal: Adequate nutrition will be maintained Outcome: Progressing   Problem: Coping: Goal: Level of anxiety will decrease Outcome: Progressing   Problem: Elimination: Goal: Will not experience complications related to bowel motility Outcome: Progressing Goal: Will not experience complications related to urinary retention Outcome: Progressing   Problem: Pain Managment: Goal: General experience of comfort will improve Outcome: Progressing   Problem: Safety: Goal: Ability to remain free from injury will improve Outcome: Progressing   Problem: Skin Integrity: Goal: Risk for impaired skin integrity will decrease Outcome: Progressing   Problem: Education: Goal: Knowledge of risk factors and measures for prevention of condition will improve Outcome: Progressing   Problem: Respiratory: Goal: Will maintain a patent airway Outcome: Progressing

## 2020-12-06 NOTE — Assessment & Plan Note (Addendum)
-  Hypoglycemic on 2/16; resolved s/p D5 infusion -Etiology considered due to poor oral intake recently

## 2020-12-06 NOTE — Evaluation (Signed)
Physical Therapy Evaluation Patient Details Name: [name] MRN: [id number] DOB: 1924/10/05 Today's Date: 12/06/2020   History of Present Illness  87 yo female admitted with lethargy, increased confusion. Found to have [place]. Hx of dementia, DM  Clinical Impression  On eval, pt required Max assist for bed mobility. She briefly sat EOB before returning to supine. She declined to try to stand. Unsure of PLOF-no family present. Pt presents with general weakness,decreased activity tolerance, and impaired gait and balance. Recommendation is for SNF unless family prefers to take pt back home. Will plan to follow during hospital stay.     Follow Up Recommendations SNF;Supervision/Assistance - 24 hour (unless family chooses to take pt back home, then HHPT)    Equipment Recommendations  None recommended by PT    Recommendations for Other Services       Precautions / Restrictions Precautions Precautions: Fall Restrictions Weight Bearing Restrictions: No      Mobility  Bed Mobility Overal bed mobility: Needs Assistance Bed Mobility: Supine to Sit;Sit to Supine     Supine to sit: Max assist;HOB elevated Sit to supine: Max assist;HOB elevated   General bed mobility comments: Assist for bil LEs and trunk. Multimodal cueing required. Pt asked for therapist hand to pull up on. Briefly sat EOB with Min assist for balance. Posterior lean.    Transfers                 General transfer comment: NT-pt declined standing  Ambulation/Gait                Stairs            Wheelchair Mobility    Modified Rankin (Stroke Patients Only)       Balance Overall balance assessment: Needs assistance Sitting-balance support: Bilateral upper extremity supported;Feet supported Sitting balance-Leahy Scale: Poor                                       Pertinent Vitals/Pain Pain Assessment: Faces Faces Pain Scale: No hurt    Home Living  Family/patient expects to be discharged to:: Unsure Living Arrangements: Children               Additional Comments: Pt is a poor historian. No family present.    Prior Function           Comments: per chart, pt was ambulatory??     Hand Dominance        Extremity/Trunk Assessment   Upper Extremity Assessment Upper Extremity Assessment: Generalized weakness    Lower Extremity Assessment Lower Extremity Assessment: Generalized weakness       Communication   Communication: HOH  Cognition Arousal/Alertness: Awake/alert Behavior During Therapy: WFL for tasks assessed/performed Overall Cognitive Status: History of cognitive impairments - at baseline                                 General Comments: does not follow 1 step commands well. Says "ok" but unable to follow through      General Comments      Exercises     Assessment/Plan    PT Assessment Patient needs continued PT services  PT Problem List Decreased strength;Decreased mobility;Decreased activity tolerance;Decreased balance;Decreased cognition;Decreased safety awareness;Decreased knowledge of use of DME       PT Treatment Interventions DME instruction;Gait training;Therapeutic activities;Therapeutic  exercise;Patient/family education;Balance training;Functional mobility training    PT Goals (Current goals can be found in the Care Plan section)  Acute Rehab PT Goals Patient Stated Goal: pt unable to state PT Goal Formulation: Patient unable to participate in goal setting Time For Goal Achievement: 12/20/20 Potential to Achieve Goals: Fair    Frequency Min 2X/week   Barriers to discharge        Co-evaluation               AM-PAC PT "6 Clicks" Mobility  Outcome Measure Help needed turning from your back to your side while in a flat bed without using bedrails?: Total Help needed moving from lying on your back to sitting on the side of a flat bed without using bedrails?:  Total Help needed moving to and from a bed to a chair (including a wheelchair)?: Total Help needed standing up from a chair using your arms (e.g., wheelchair or bedside chair)?: Total Help needed to walk in hospital room?: Total Help needed climbing 3-5 steps with a railing? : Total 6 Click Score: 6    End of Session   Activity Tolerance: Patient tolerated treatment well Patient left: in bed;with call bell/phone within reach;with bed alarm set   PT Visit Diagnosis: Muscle weakness (generalized) (M62.81);Difficulty in walking, not elsewhere classified (R26.2)    Time: 3009-2330 PT Time Calculation (min) (ACUTE ONLY): 8 min   Charges:   PT Evaluation $PT Eval Moderate Complexity: 1 Mod             Doreatha Massed, PT Acute Rehabilitation  Office: [phone] Pager: [phone]

## 2020-12-06 NOTE — Assessment & Plan Note (Addendum)
-  Treated with SSI and CBG monitoring in hospital

## 2020-12-06 NOTE — Evaluation (Signed)
Clinical/Bedside Swallow Evaluation Patient Details  Name: [name] MRN: [id number] Date of Birth: [birth year]  Today's Date: 12/06/2020 Time: SLP Start Time (ACUTE ONLY): 1093 SLP Stop Time (ACUTE ONLY): 1704 SLP Time Calculation (min) (ACUTE ONLY): 29 min  Past Medical History:  Past Medical History:  Diagnosis Date  . Arthritis   . Dementia ([place])   . Diabetes mellitus without complication ([place])   . GERD (gastroesophageal reflux disease)   . Glaucoma   . Hypertension    Past Surgical History: History reviewed. No pertinent surgical history. HPI:  pt is a 87 yo female adm to [facility] with constipation and poor po intake - found to be dehydrated.  She was found to have COVID 19 but is on room air without respiratory symptoms.  Pt has h/o dementia, GERD, HTN.  Swallow evaluation ordered.  Per RN, son advised he has to force pt to eat.  CXR and CT head negative for acute changes. RN reports pt with some coughing during intake - especially taking pill with OJ.   Assessment / Plan / Recommendation Clinical Impression  Pt presents with clinical indications of pharyngeal and esophageal dysphagia with MINIMAL intake she would consume.  Pt opens her mouth minimally to allow few bites/sips only.  No focal CN deficits apparent and she was able to aid in holding her own cup.   Clinical presentation of delayed oral transiting, potential delayed pharyngeal swallow noted without oral retention.  Pt accepted only two 1/4 spoon fulls of ice cream, 2 boluses of gingerale and 2 boluses of Ensure mixed with icecream and gingerale before she declined all further intake. Swallow was only minimally delayed per clinical judgement/observation of laryngeal elevation.   She demonstrated belching post-swallow of liquids concerning for potential reflux with known hx.  In addition, swallowing appeared effortful and delayed for her as pt winced and flexed her chin down with audible swallow- ? odynophagia from  COVID?  She did not verbalize pain however.   Cough x1 noted with intake of thin liquid but diminished with self feeding.    Pt advised she did not want more intake and when SLP Inquired as to why, pt appeared frustrated raising her voice and saying "Because I don't!" causing SLP concern for being forced to eat at home PTA.    Upon leaving the room with pt HOB at approx 70*, pt heard coughing AFTER SLP left the room- ? UES dysfunction.  Recommend dys1 and II /thin diet with strict precautions.     Given pt's dementia, suspected poor intake PTA, advanced age and now COVID - would recommend a palliative consult.    Would not recommend an MBS at this time as do not anticipate will change pt's outcomes.  CXR negative and RN reports pt having bowel movements today.   SLP Visit Diagnosis: Dysphagia, unspecified (R13.10)    Aspiration Risk  Mild aspiration risk    Diet Recommendation Dysphagia 2 (Fine chop);Dysphagia 1 (Puree);Thin liquid   Liquid Administration via: Cup;Straw Medication Administration: Crushed with puree Supervision: Staff to assist with self feeding Compensations: Slow rate;Small sips/bites Postural Changes: Seated upright at 90 degrees;Remain upright for at least 30 minutes after po intake    Other  Recommendations Oral Care Recommendations: Oral care BID Other Recommendations: Have oral suction available   Follow up Recommendations        Frequency and Duration min 1 x/week  1 week       Prognosis Prognosis for Safe Diet Advancement: Fair Barriers  to Reach Goals: Cognitive deficits;Motivation      Swallow Study   General Date of Onset: 12/06/20 HPI: pt is a 87 yo female adm to [facility] with constipation and poor po intake - found to be dehydrated.  She was found to have COVID 19 but is on room air without respiratory symptoms.  Pt has h/o dementia, GERD, HTN.  Swallow evaluation ordered.  Per RN, son advised he has to force pt to eat.  CXR and CT head negative for  acute changes. RN reports pt with some coughing during intake - especially taking pill with OJ. Type of Study: Bedside Swallow Evaluation Previous Swallow Assessment: none Diet Prior to this Study: Regular Temperature Spikes Noted: No Respiratory Status: Room air History of Recent Intubation: No Behavior/Cognition: Distractible;Requires cueing;Alert Oral Cavity Assessment: Within Functional Limits Oral Care Completed by SLP: No Oral Cavity - Dentition: Dentures, top;Dentures, bottom Self-Feeding Abilities: Needs assist Patient Positioning: Upright in bed Baseline Vocal Quality: Low vocal intensity Volitional Cough: Cognitively unable to elicit Volitional Swallow: Unable to elicit    Oral/Motor/Sensory Function Overall Oral Motor/Sensory Function: Within functional limits   Ice Chips Ice chips: Not tested   Thin Liquid Thin Liquid: Impaired Presentation: Cup;Spoon;Straw Pharyngeal  Phase Impairments: Suspected delayed Swallow;Cough - Immediate Other Comments: swallowing appears effortful= pt winces with delayed swallow - SLP uncertain if pt may have some odynophagia with COVID - but she did not report overt pain, subtle cough x1 noted with thin - not observed when pt was holding her own cup and accepting intake via cup or straw -    Nectar Thick Nectar Thick Liquid: Impaired Presentation: Straw;Spoon Pharyngeal Phase Impairments: Suspected delayed Swallow Other Comments: wincing with swallow   Honey Thick Honey Thick Liquid: Not tested   Puree Puree: Impaired Presentation: Spoon Oral Phase Functional Implications: Other (comment) (pt accepting very small bites) Pharyngeal Phase Impairments: Throat Clearing - Delayed;Suspected delayed Swallow Other Comments: ice cream - pt accepted only 2 boluses before declining more   Solid     Other Comments: DNT - but pt able to articulate clearly     Kathleen Lime, MS [place] Office [phone] Pager  [phone]   Macario Golds 12/06/2020,6:06 PM

## 2020-12-06 NOTE — Consult Note (Addendum)
WOC consult requested. Pt was noted to have a stage 2 pressure injury to the right buttock on the nursing flowsheet, which was present on admission.  The bedside nurses can use the standard skin care order set for stage 2 pressure injuries, and apply a foam dressing to protect from further injury. Please re-consult if further assistance is needed.  Thank-you,  Julien Girt MSN, [place], [place], [place], [place]

## 2020-12-06 NOTE — Assessment & Plan Note (Addendum)
-  Conversing well still in what appears to be her baseline state of dementia -Discussed with family the prognosis associated with dementia as well as the progressive course. -Suspect she will likely require further hospitalizations with recurrent failure to thrive.  SNF offered to family however declining at this time

## 2020-12-06 NOTE — Assessment & Plan Note (Addendum)
-  Stool burden appreciated on abdominal x-ray on admission - given laxatives in hospital

## 2020-12-06 NOTE — Hospital Course (Addendum)
[name] is a 87 yo female with PMH dementia, HTN, DMII, GERD who presented to the hospital with reports of worsening constipation and decreased oral intake.  Due to her dementia, collateral information was obtained from family on admission.  In addition to her constipation lethargy she also had a reported dry cough.  She also had known exposure to family members with COVID-19.  On work-up, she was found to be positive for COVID-19.  She was clinically dehydrated and placed on IV fluids.  She was started on a bowel regimen for her constipation.  She was not found to be hypoxic however did have symptoms considered to be contributed to COVID-19 and was therefore started on a 3-day course of remdesivir.  She responded well to treatment in general and mentation returned back to her normal baseline of dementia after fluid resuscitation. Multiple discussions were held with family on the phone regarding her underlying dementia and generalized failure to thrive.  She was offered placement in SNF/rehab however family declined.  Discussed that her underlying dementia will predispose her to likely further failure to thrive in the future and recurrent dehydration/malnutrition. The patient was considered stable at time of discharge and back to previous baseline.  This was discussed with her son and transportation was arranged home at time of discharge.

## 2020-12-06 NOTE — Assessment & Plan Note (Addendum)
-   stage II per WOC, appreciate assistance -Recommended for frequent turning at home upon discharge

## 2020-12-06 NOTE — Progress Notes (Addendum)
PROGRESS NOTE    [name]   FGH:[id number]  DOB: November 28, 1923  DOA: 12/05/2020     0  PCP: Merrilee Seashore, MD  CC: weakness  Hospital Course: [name] is a 87 yo female with PMH dementia, HTN, DMII, GERD who presented to the hospital with reports of worsening constipation and decreased oral intake.  Due to her dementia, collateral information was obtained from family on admission.  In addition to her constipation lethargy she also had a reported dry cough.  She also had known exposure to family members with COVID-19.  On work-up, she was found to be positive for COVID-19.  She was clinically dehydrated and placed on IV fluids.  She was started on a bowel regimen for her constipation.  She was not found to be hypoxic however did have symptoms considered to be contributed to COVID-19 and was therefore started on a 3-day course of remdesivir.   Interval History:  Resting in bed in no distress.  Able to state her name.  Denies any chest pain or shortness of breath.  Old records reviewed in assessment of this patient  ROS: Review of systems not obtained due to patient factors. Cognitive impairment   Assessment & Plan: * COVID-19 virus infection -Patient had 1 week history of reported symptoms including lethargy, confusion, dry cough -No hypoxia or respiratory symptoms.  Patient started on 3-day course of remdesivir -No indication for steroids -Continue zinc and vitamin C -Continue breathing treatments as needed  Hypoglycemia -Hypoglycemic this a.m. requiring D50 by staff -Start on D5 LR and continue monitoring CBGs -Etiology considered due to poor oral intake recently  Type 2 diabetes mellitus with hyperglycemia, without long-term current use of insulin (HCC) - continue SSI and CBG monitoring   Pressure ulcer, buttock - stage II per WOC, appreciate assistance - continue foam dressing and turning per protocol   Constipation -Stool burden appreciated on  abdominal x-ray on admission -Continue bowel regimen  Dementia without behavioral disturbance ([place]) -Conversing well this morning in what appears to be her baseline state of dementia  Acute metabolic encephalopathy-resolved as of 12/06/2020 - patient symptoms include lethargy - etiology considered due to metabolic abnormalities from dehydration - continue IVF - mentation appears back to normal dementia baseline on 2/16   Antimicrobials:   DVT prophylaxis: enoxaparin (LOVENOX) injection 40 mg Start: 12/06/20 1000   Code Status:   Code Status: Full Code Family Communication:   Disposition Plan: Status is: Inpatient  Remains inpatient appropriate because:IV treatments appropriate due to intensity of illness or inability to take PO and Inpatient level of care appropriate due to severity of illness   Dispo: The patient is from: Home              Anticipated d/c is to: Home              Anticipated d/c date is: 2 days              Patient currently is not medically stable to d/c.   Difficult to place patient No      Risk of unplanned readmission score:     Objective: Blood pressure (!) 197/81, pulse 65, temperature 98.4 F (36.9 C), temperature source Oral, resp. rate 16, height 5\' 3"  (1.6 m), weight 65.2 kg, SpO2 100 %.  Examination: General appearance: dementia noted; pleasant, cooperative, NAD Head: Normocephalic, without obvious abnormality, atraumatic Eyes: EOMI Lungs: clear to auscultation bilaterally Heart: regular rate and rhythm and S1, S2 normal Abdomen: normal  findings: bowel sounds normal and soft, non-tender Extremities: no edema Skin: mobility and turgor normal Neurologic: moves all 4 extremities, follows commands  Consultants:     Procedures:     Data Reviewed: I have personally reviewed following labs and imaging studies Results for orders placed or performed during the hospital encounter of 12/05/20 (from the past 24 hour(s))  CBC with  Differential/Platelet     Status: Abnormal   Collection Time: 12/05/20  7:07 PM  Result Value Ref Range   WBC 4.3 4.0 - 10.5 K/uL   RBC 3.97 3.87 - 5.11 MIL/uL   Hemoglobin 10.2 (L) 12.0 - 15.0 g/dL   HCT 33.8 (L) 36.0 - 46.0 %   MCV 85.1 80.0 - 100.0 fL   MCH 25.7 (L) 26.0 - 34.0 pg   MCHC 30.2 30.0 - 36.0 g/dL   RDW 14.6 11.5 - 15.5 %   Platelets 250 150 - 400 K/uL   nRBC 0.0 0.0 - 0.2 %   Neutrophils Relative % 59 %   Neutro Abs 2.6 1.7 - 7.7 K/uL   Lymphocytes Relative 26 %   Lymphs Abs 1.1 0.7 - 4.0 K/uL   Monocytes Relative 14 %   Monocytes Absolute 0.6 0.1 - 1.0 K/uL   Eosinophils Relative 1 %   Eosinophils Absolute 0.0 0.0 - 0.5 K/uL   Basophils Relative 0 %   Basophils Absolute 0.0 0.0 - 0.1 K/uL   Immature Granulocytes 0 %   Abs Immature Granulocytes 0.01 0.00 - 0.07 K/uL   Reactive, Benign Lymphocytes PRESENT   Comprehensive metabolic panel     Status: Abnormal   Collection Time: 12/05/20  7:07 PM  Result Value Ref Range   Sodium 143 135 - 145 mmol/L   Potassium 3.6 3.5 - 5.1 mmol/L   Chloride 106 98 - 111 mmol/L   CO2 26 22 - 32 mmol/L   Glucose, Bld 134 (H) 70 - 99 mg/dL   BUN 16 8 - 23 mg/dL   Creatinine, Ser 0.61 0.44 - 1.00 mg/dL   Calcium 8.6 (L) 8.9 - 10.3 mg/dL   Total Protein 7.2 6.5 - 8.1 g/dL   Albumin 3.2 (L) 3.5 - 5.0 g/dL   AST 20 15 - 41 U/L   ALT 14 0 - 44 U/L   Alkaline Phosphatase 62 38 - 126 U/L   Total Bilirubin 0.8 0.3 - 1.2 mg/dL   GFR, Estimated >60 >60 mL/min   Anion gap 11 5 - 15  Resp Panel by RT-PCR (Flu A&B, Covid) Nasopharyngeal Swab     Status: Abnormal   Collection Time: 12/05/20  7:07 PM   Specimen: Nasopharyngeal Swab; Nasopharyngeal(NP) swabs in vial transport medium  Result Value Ref Range   SARS Coronavirus 2 by RT PCR POSITIVE (A) NEGATIVE   Influenza A by PCR NEGATIVE NEGATIVE   Influenza B by PCR NEGATIVE NEGATIVE  Lactic acid, plasma     Status: None   Collection Time: 12/05/20 11:18 PM  Result Value Ref Range    Lactic Acid, Venous 1.0 0.5 - 1.9 mmol/L  D-dimer, quantitative     Status: Abnormal   Collection Time: 12/05/20 11:18 PM  Result Value Ref Range   D-Dimer, Quant 1.57 (H) 0.00 - 0.50 ug/mL-FEU  Procalcitonin     Status: None   Collection Time: 12/05/20 11:18 PM  Result Value Ref Range   Procalcitonin 0.10 ng/mL  Lactate dehydrogenase     Status: Abnormal   Collection Time: 12/05/20 11:18 PM  Result Value  Ref Range   LDH 214 (H) 98 - 192 U/L  Ferritin     Status: None   Collection Time: 12/05/20 11:18 PM  Result Value Ref Range   Ferritin 54 11 - 307 ng/mL  Triglycerides     Status: None   Collection Time: 12/05/20 11:18 PM  Result Value Ref Range   Triglycerides 97 <150 mg/dL  Fibrinogen     Status: Abnormal   Collection Time: 12/05/20 11:18 PM  Result Value Ref Range   Fibrinogen 514 (H) 210 - 475 mg/dL  C-reactive protein     Status: Abnormal   Collection Time: 12/05/20 11:18 PM  Result Value Ref Range   CRP 6.4 (H) <1.0 mg/dL  Lactic acid, plasma     Status: None   Collection Time: 12/06/20  1:10 AM  Result Value Ref Range   Lactic Acid, Venous 1.0 0.5 - 1.9 mmol/L  CBC with Differential/Platelet     Status: Abnormal   Collection Time: 12/06/20  1:10 AM  Result Value Ref Range   WBC 4.3 4.0 - 10.5 K/uL   RBC 3.84 (L) 3.87 - 5.11 MIL/uL   Hemoglobin 9.9 (L) 12.0 - 15.0 g/dL   HCT 33.1 (L) 36.0 - 46.0 %   MCV 86.2 80.0 - 100.0 fL   MCH 25.8 (L) 26.0 - 34.0 pg   MCHC 29.9 (L) 30.0 - 36.0 g/dL   RDW 14.7 11.5 - 15.5 %   Platelets 254 150 - 400 K/uL   nRBC 0.0 0.0 - 0.2 %   Neutrophils Relative % 63 %   Neutro Abs 2.7 1.7 - 7.7 K/uL   Lymphocytes Relative 24 %   Lymphs Abs 1.0 0.7 - 4.0 K/uL   Monocytes Relative 11 %   Monocytes Absolute 0.5 0.1 - 1.0 K/uL   Eosinophils Relative 1 %   Eosinophils Absolute 0.0 0.0 - 0.5 K/uL   Basophils Relative 0 %   Basophils Absolute 0.0 0.0 - 0.1 K/uL   Immature Granulocytes 1 %   Abs Immature Granulocytes 0.03 0.00 - 0.07  K/uL   Reactive, Benign Lymphocytes PRESENT   Comprehensive metabolic panel     Status: Abnormal   Collection Time: 12/06/20  1:10 AM  Result Value Ref Range   Sodium 141 135 - 145 mmol/L   Potassium 3.7 3.5 - 5.1 mmol/L   Chloride 105 98 - 111 mmol/L   CO2 24 22 - 32 mmol/L   Glucose, Bld 141 (H) 70 - 99 mg/dL   BUN 15 8 - 23 mg/dL   Creatinine, Ser 0.55 0.44 - 1.00 mg/dL   Calcium 8.3 (L) 8.9 - 10.3 mg/dL   Total Protein 6.7 6.5 - 8.1 g/dL   Albumin 3.0 (L) 3.5 - 5.0 g/dL   AST 17 15 - 41 U/L   ALT 11 0 - 44 U/L   Alkaline Phosphatase 53 38 - 126 U/L   Total Bilirubin 0.8 0.3 - 1.2 mg/dL   GFR, Estimated >60 >60 mL/min   Anion gap 12 5 - 15  C-reactive protein     Status: Abnormal   Collection Time: 12/06/20  1:10 AM  Result Value Ref Range   CRP 6.3 (H) <1.0 mg/dL  D-dimer, quantitative (not at [facility])     Status: Abnormal   Collection Time: 12/06/20  1:10 AM  Result Value Ref Range   D-Dimer, Quant 1.85 (H) 0.00 - 0.50 ug/mL-FEU  Magnesium     Status: None   Collection Time: 12/06/20  1:10  AM  Result Value Ref Range   Magnesium 1.9 1.7 - 2.4 mg/dL  Glucose, capillary     Status: Abnormal   Collection Time: 12/06/20  8:18 AM  Result Value Ref Range   Glucose-Capillary 57 (L) 70 - 99 mg/dL  Glucose, capillary     Status: Abnormal   Collection Time: 12/06/20  9:04 AM  Result Value Ref Range   Glucose-Capillary 174 (H) 70 - 99 mg/dL  Glucose, capillary     Status: Abnormal   Collection Time: 12/06/20 11:41 AM  Result Value Ref Range   Glucose-Capillary 185 (H) 70 - 99 mg/dL    Recent Results (from the past 240 hour(s))  Resp Panel by RT-PCR (Flu A&B, Covid) Nasopharyngeal Swab     Status: Abnormal   Collection Time: 12/05/20  7:07 PM   Specimen: Nasopharyngeal Swab; Nasopharyngeal(NP) swabs in vial transport medium  Result Value Ref Range Status   SARS Coronavirus 2 by RT PCR POSITIVE (A) NEGATIVE Final    Comment: RESULT CALLED TO, READ BACK BY AND VERIFIED  WITH: REBECCA, RN @ 2131 ON 12/05/20 C VARNER (NOTE) SARS-CoV-2 target nucleic acids are DETECTED.  The SARS-CoV-2 RNA is generally detectable in upper respiratory specimens during the acute phase of infection. Positive results are indicative of the presence of the identified virus, but do not rule out bacterial infection or co-infection with other pathogens not detected by the test. Clinical correlation with patient history and other diagnostic information is necessary to determine patient infection status. The expected result is Negative.  Fact Sheet for Patients: EntrepreneurPulse.com.au  Fact Sheet for Healthcare Providers: IncredibleEmployment.be  This test is not yet approved or cleared by the [place] FDA and  has been authorized for detection and/or diagnosis of SARS-CoV-2 by FDA under an Emergency Use Authorization (EUA).  This EUA will remain in effect (meaning this test c an be used) for the duration of  the COVID-19 declaration under Section 564(b)(1) of the Act, 21 U.S.C. section 360bbb-3(b)(1), unless the authorization is terminated or revoked sooner.     Influenza A by PCR NEGATIVE NEGATIVE Final   Influenza B by PCR NEGATIVE NEGATIVE Final    Comment: (NOTE) The Xpert Xpress SARS-CoV-2/FLU/RSV plus assay is intended as an aid in the diagnosis of influenza from Nasopharyngeal swab specimens and should not be used as a sole basis for treatment. Nasal washings and aspirates are unacceptable for Xpert Xpress SARS-CoV-2/FLU/RSV testing.  Fact Sheet for Patients: EntrepreneurPulse.com.au  Fact Sheet for Healthcare Providers: IncredibleEmployment.be  This test is not yet approved or cleared by the [place] FDA and has been authorized for detection and/or diagnosis of SARS-CoV-2 by FDA under an Emergency Use Authorization (EUA). This EUA will remain in effect (meaning this test can be  used) for the duration of the COVID-19 declaration under Section 564(b)(1) of the Act, 21 U.S.C. section 360bbb-3(b)(1), unless the authorization is terminated or revoked.  Performed at [facility], [place] [address], [place], [place] [address]      Radiology Studies: DG Abdomen 1 View  Result Date: 12/05/2020 CLINICAL DATA:  Constipation. EXAM: ABDOMEN - 1 VIEW COMPARISON:  Abdominal CT 08/03/2018 FINDINGS: No bowel dilatation to suggest obstruction. No evidence of free air on this portable supine view. Moderate stool in the distal transverse, descending, and sigmoid colon. Mild to moderate rectal distention with stool. Small volume of stool in the more proximal colon. No radiopaque calculi or abnormal soft tissue calcifications. Bones are diffusely under mineralized. IMPRESSION: Moderate  colonic stool burden with mild to moderate rectal distention. No bowel obstruction. Electronically Signed   By: Keith Rake M.D.   On: 12/05/2020 19:39   CT Head Wo Contrast  Result Date: 12/05/2020 CLINICAL DATA:  Altered mental status. EXAM: CT HEAD WITHOUT CONTRAST TECHNIQUE: Contiguous axial images were obtained from the base of the skull through the vertex without intravenous contrast. COMPARISON:  September 12, 2017 FINDINGS: Brain: There is moderate severity cerebral atrophy with widening of the extra-axial spaces and ventricular dilatation. Extra-axial space widening is most prominent within the bilateral frontal regions and is unchanged in appearance when compared to the prior exam. There are areas of decreased attenuation within the white matter tracts of the supratentorial brain, consistent with microvascular disease changes. Vascular: No hyperdense vessel or unexpected calcification. Skull: Normal. Negative for fracture or focal lesion. Sinuses/Orbits: No acute finding. Other: None. IMPRESSION: 1. Generalized cerebral atrophy. 2. No acute intracranial abnormality. Electronically  Signed   By: Virgina Norfolk M.D.   On: 12/05/2020 20:38   DG Chest Port 1 View  Result Date: 12/05/2020 CLINICAL DATA:  Cough. EXAM: PORTABLE CHEST 1 VIEW COMPARISON:  08/03/2018 FINDINGS: Upper normal heart size.The cardiomediastinal contours are unchanged. Aortic atherosclerosis. Pulmonary vasculature is normal. No consolidation, pleural effusion, or pneumothorax. No acute osseous abnormalities are seen. Thoracic spondylosis. IMPRESSION: No acute abnormality. Electronically Signed   By: Keith Rake M.D.   On: 12/05/2020 19:38   CT Head Wo Contrast  Final Result    DG Abdomen 1 View  Final Result    DG Chest Port 1 View  Final Result      Scheduled Meds: . vitamin C  500 mg Oral Daily  . divalproex  250 mg Oral BID  . memantine  28 mg Oral QHS   And  . donepezil  10 mg Oral QHS  . dorzolamide-timolol  1 drop Both Eyes TID  . enoxaparin (LOVENOX) injection  40 mg Subcutaneous Q24H  . insulin aspart  0-15 Units Subcutaneous TID AC & HS  . latanoprost  1 drop Both Eyes QHS  . polyethylene glycol  17 g Oral Daily  . senna-docusate  1 tablet Oral BID  . zinc sulfate  220 mg Oral Daily   PRN Meds: acetaminophen, albuterol, guaiFENesin-dextromethorphan, ondansetron **OR** ondansetron (ZOFRAN) IV, polyethylene glycol Continuous Infusions: . dextrose 5% lactated ringers 50 mL/hr at 12/06/20 0845  . remdesivir 100 mg in NS 100 mL 100 mg (12/06/20 0913)     LOS: 0 days  Time spent: Greater than 50% of the 35 minute visit was spent in counseling/coordination of care for the patient as laid out in the A&P.   Dwyane Dee, MD [facility] 12/06/2020, 1:58 PM

## 2020-12-07 DIAGNOSIS — U071 COVID-19: Secondary | ICD-10-CM | POA: Diagnosis not present

## 2020-12-07 LAB — CBC WITH DIFFERENTIAL/PLATELET
Abs Immature Granulocytes: 0.02 10*3/uL (ref 0.00–0.07)
Basophils Absolute: 0 10*3/uL (ref 0.0–0.1)
Basophils Relative: 0 %
Eosinophils Absolute: 0 10*3/uL (ref 0.0–0.5)
Eosinophils Relative: 1 %
HCT: 33.4 % — ABNORMAL LOW (ref 36.0–46.0)
Hemoglobin: 10.1 g/dL — ABNORMAL LOW (ref 12.0–15.0)
Immature Granulocytes: 1 %
Lymphocytes Relative: 28 %
Lymphs Abs: 0.9 10*3/uL (ref 0.7–4.0)
MCH: 25.5 pg — ABNORMAL LOW (ref 26.0–34.0)
MCHC: 30.2 g/dL (ref 30.0–36.0)
MCV: 84.3 fL (ref 80.0–100.0)
Monocytes Absolute: 0.6 10*3/uL (ref 0.1–1.0)
Monocytes Relative: 17 %
Neutro Abs: 1.8 10*3/uL (ref 1.7–7.7)
Neutrophils Relative %: 53 %
Platelets: 252 10*3/uL (ref 150–400)
RBC: 3.96 MIL/uL (ref 3.87–5.11)
RDW: 14.5 % (ref 11.5–15.5)
WBC: 3.3 10*3/uL — ABNORMAL LOW (ref 4.0–10.5)
nRBC: 0 % (ref 0.0–0.2)

## 2020-12-07 LAB — COMPREHENSIVE METABOLIC PANEL
ALT: 11 U/L (ref 0–44)
AST: 17 U/L (ref 15–41)
Albumin: 2.6 g/dL — ABNORMAL LOW (ref 3.5–5.0)
Alkaline Phosphatase: 54 U/L (ref 38–126)
Anion gap: 12 (ref 5–15)
BUN: 9 mg/dL (ref 8–23)
CO2: 26 mmol/L (ref 22–32)
Calcium: 8.2 mg/dL — ABNORMAL LOW (ref 8.9–10.3)
Chloride: 102 mmol/L (ref 98–111)
Creatinine, Ser: 0.44 mg/dL (ref 0.44–1.00)
GFR, Estimated: >60 mL/min (ref >60)
Glucose, Bld: 96 mg/dL (ref 70–99)
Potassium: 3.1 mmol/L — ABNORMAL LOW (ref 3.5–5.1)
Sodium: 140 mmol/L (ref 135–145)
Total Bilirubin: 0.7 mg/dL (ref 0.3–1.2)
Total Protein: 6.2 g/dL — ABNORMAL LOW (ref 6.5–8.1)

## 2020-12-07 LAB — GLUCOSE, CAPILLARY
Glucose-Capillary: 98 mg/dL (ref 70–99)
Glucose-Capillary: 105 mg/dL — ABNORMAL HIGH (ref 70–99)
Glucose-Capillary: 123 mg/dL — ABNORMAL HIGH (ref 70–99)
Glucose-Capillary: 164 mg/dL — ABNORMAL HIGH (ref 70–99)

## 2020-12-07 LAB — HEMOGLOBIN A1C
Hgb A1c MFr Bld: 6.5 % — ABNORMAL HIGH (ref 4.8–5.6)
Mean Plasma Glucose: 140 mg/dL

## 2020-12-07 LAB — D-DIMER, QUANTITATIVE: D-Dimer, Quant: 3.86 ug/mL-FEU — ABNORMAL HIGH (ref 0.00–0.50)

## 2020-12-07 LAB — LACTATE DEHYDROGENASE: LDH: 193 U/L — ABNORMAL HIGH (ref 98–192)

## 2020-12-07 LAB — MAGNESIUM: Magnesium: 1.5 mg/dL — ABNORMAL LOW (ref 1.7–2.4)

## 2020-12-07 LAB — C-REACTIVE PROTEIN: CRP: 5.6 mg/dL — ABNORMAL HIGH (ref <1.0)

## 2020-12-07 LAB — PROCALCITONIN: Procalcitonin: <0.1 ng/mL

## 2020-12-07 MED ORDER — MAGNESIUM SULFATE 2 GM/50ML IV SOLN
2.0000 g | Freq: Once | INTRAVENOUS | Status: AC
  Administered 2020-12-07: 2 g via INTRAVENOUS
  Filled 2020-12-07: qty 50

## 2020-12-07 MED ORDER — HYDRALAZINE HCL 20 MG/ML IJ SOLN
10.0000 mg | INTRAMUSCULAR | Status: DC | PRN
  Administered 2020-12-07: 10 mg via INTRAVENOUS
  Filled 2020-12-07: qty 1

## 2020-12-07 MED ORDER — POTASSIUM CHLORIDE 20 MEQ PO PACK
40.0000 meq | PACK | ORAL | Status: AC
  Administered 2020-12-07 (×2): 40 meq via ORAL
  Filled 2020-12-07 (×2): qty 2

## 2020-12-07 MED ORDER — LABETALOL HCL 5 MG/ML IV SOLN: 10.0000 mg | INTRAVENOUS | Status: DC | PRN

## 2020-12-07 NOTE — Progress Notes (Signed)
PROGRESS NOTE    [name]   DGU:[id number]  DOB: [birth year]  DOA: 12/05/2020     1  PCP: Merrilee Seashore, MD  CC: weakness  Hospital Course: Ms. Verge is a 87 yo female with PMH dementia, HTN, DMII, GERD who presented to the hospital with reports of worsening constipation and decreased oral intake.  Due to her dementia, collateral information was obtained from family on admission.  In addition to her constipation lethargy she also had a reported dry cough.  She also had known exposure to family members with COVID-19.  On work-up, she was found to be positive for COVID-19.  She was clinically dehydrated and placed on IV fluids.  She was started on a bowel regimen for her constipation.  She was not found to be hypoxic however did have symptoms considered to be contributed to COVID-19 and was therefore started on a 3-day course of remdesivir.   Interval History:  No events overnight.  Appetite still remains poor but she is comfortably resting in bed.  Could not remember if she had breakfast or not this morning.  Old records reviewed in assessment of this patient  ROS: Review of systems not obtained due to patient factors. Cognitive impairment   Assessment & Plan: * COVID-19 virus infection -Patient had 1 week history of reported symptoms including lethargy, confusion, dry cough -No hypoxia or respiratory symptoms.  Patient started on 3-day course of remdesivir -No indication for steroids -Continue zinc and vitamin C -Continue breathing treatments as needed  Hypoglycemia -Hypoglycemic on 2/16; resolved s/p D5 infusion -Etiology considered due to poor oral intake recently - continue for now; stable but intake not adequate enough yet   Type 2 diabetes mellitus with hyperglycemia, without long-term current use of insulin (HCC) - continue SSI and CBG monitoring   Pressure ulcer, buttock - stage II per WOC, appreciate assistance - continue foam dressing and turning  per protocol   Constipation -Stool burden appreciated on abdominal x-ray on admission -Continue bowel regimen  Dementia without behavioral disturbance ([place]) -Conversing well still in what appears to be her baseline state of dementia  Acute metabolic encephalopathy-resolved as of 12/06/2020 - patient symptoms include lethargy - etiology considered due to metabolic abnormalities from dehydration - continue IVF - mentation appears back to normal dementia baseline on 2/16   Antimicrobials:   DVT prophylaxis: enoxaparin (LOVENOX) injection 40 mg Start: 12/06/20 1000   Code Status:   Code Status: Full Code Family Communication:   Disposition Plan: Status is: Inpatient  Remains inpatient appropriate because:IV treatments appropriate due to intensity of illness or inability to take PO and Inpatient level of care appropriate due to severity of illness   Dispo: The patient is from: Home              Anticipated d/c is to: Home              Anticipated d/c date is: 2 days              Patient currently is not medically stable to d/c.   Difficult to place patient No  Risk of unplanned readmission score: Unplanned Admission- Pilot do not use: 11.04   Objective: Blood pressure (!) 197/84, pulse 63, temperature 98.7 F (37.1 C), temperature source Oral, resp. rate 18, height 5\' 3"  (1.6 m), weight 65.2 kg, SpO2 100 %.  Examination: General appearance: dementia noted; pleasant, cooperative, NAD Head: Normocephalic, without obvious abnormality, atraumatic Eyes: EOMI Lungs: clear to auscultation bilaterally Heart:  regular rate and rhythm and S1, S2 normal Abdomen: normal findings: bowel sounds normal and soft, non-tender Extremities: no edema Skin: mobility and turgor normal Neurologic: moves all 4 extremities, follows commands  Consultants:     Procedures:     Data Reviewed: I have personally reviewed following labs and imaging studies Results for orders placed or performed  during the hospital encounter of 12/05/20 (from the past 24 hour(s))  Glucose, capillary     Status: Abnormal   Collection Time: 12/06/20  4:59 PM  Result Value Ref Range   Glucose-Capillary 139 (H) 70 - 99 mg/dL  Glucose, capillary     Status: Abnormal   Collection Time: 12/06/20  8:42 PM  Result Value Ref Range   Glucose-Capillary 154 (H) 70 - 99 mg/dL  CBC with Differential/Platelet     Status: Abnormal   Collection Time: 12/07/20  4:08 AM  Result Value Ref Range   WBC 3.3 (L) 4.0 - 10.5 K/uL   RBC 3.96 3.87 - 5.11 MIL/uL   Hemoglobin 10.1 (L) 12.0 - 15.0 g/dL   HCT 33.4 (L) 36.0 - 46.0 %   MCV 84.3 80.0 - 100.0 fL   MCH 25.5 (L) 26.0 - 34.0 pg   MCHC 30.2 30.0 - 36.0 g/dL   RDW 14.5 11.5 - 15.5 %   Platelets 252 150 - 400 K/uL   nRBC 0.0 0.0 - 0.2 %   Neutrophils Relative % 53 %   Neutro Abs 1.8 1.7 - 7.7 K/uL   Lymphocytes Relative 28 %   Lymphs Abs 0.9 0.7 - 4.0 K/uL   Monocytes Relative 17 %   Monocytes Absolute 0.6 0.1 - 1.0 K/uL   Eosinophils Relative 1 %   Eosinophils Absolute 0.0 0.0 - 0.5 K/uL   Basophils Relative 0 %   Basophils Absolute 0.0 0.0 - 0.1 K/uL   Immature Granulocytes 1 %   Abs Immature Granulocytes 0.02 0.00 - 0.07 K/uL  Comprehensive metabolic panel     Status: Abnormal   Collection Time: 12/07/20  4:08 AM  Result Value Ref Range   Sodium 140 135 - 145 mmol/L   Potassium 3.1 (L) 3.5 - 5.1 mmol/L   Chloride 102 98 - 111 mmol/L   CO2 26 22 - 32 mmol/L   Glucose, Bld 96 70 - 99 mg/dL   BUN 9 8 - 23 mg/dL   Creatinine, Ser 0.44 0.44 - 1.00 mg/dL   Calcium 8.2 (L) 8.9 - 10.3 mg/dL   Total Protein 6.2 (L) 6.5 - 8.1 g/dL   Albumin 2.6 (L) 3.5 - 5.0 g/dL   AST 17 15 - 41 U/L   ALT 11 0 - 44 U/L   Alkaline Phosphatase 54 38 - 126 U/L   Total Bilirubin 0.7 0.3 - 1.2 mg/dL   GFR, Estimated >60 >60 mL/min   Anion gap 12 5 - 15  C-reactive protein     Status: Abnormal   Collection Time: 12/07/20  4:08 AM  Result Value Ref Range   CRP 5.6 (H) <1.0  mg/dL  D-dimer, quantitative (not at [facility])     Status: Abnormal   Collection Time: 12/07/20  4:08 AM  Result Value Ref Range   D-Dimer, Quant 3.86 (H) 0.00 - 0.50 ug/mL-FEU  Magnesium     Status: Abnormal   Collection Time: 12/07/20  4:08 AM  Result Value Ref Range   Magnesium 1.5 (L) 1.7 - 2.4 mg/dL  Lactate dehydrogenase     Status: Abnormal   Collection Time:  12/07/20  4:08 AM  Result Value Ref Range   LDH 193 (H) 98 - 192 U/L  Procalcitonin     Status: None   Collection Time: 12/07/20  4:08 AM  Result Value Ref Range   Procalcitonin <0.10 ng/mL  Glucose, capillary     Status: None   Collection Time: 12/07/20  7:27 AM  Result Value Ref Range   Glucose-Capillary 98 70 - 99 mg/dL  Glucose, capillary     Status: Abnormal   Collection Time: 12/07/20 11:43 AM  Result Value Ref Range   Glucose-Capillary 105 (H) 70 - 99 mg/dL    Recent Results (from the past 240 hour(s))  Resp Panel by RT-PCR (Flu A&B, Covid) Nasopharyngeal Swab     Status: Abnormal   Collection Time: 12/05/20  7:07 PM   Specimen: Nasopharyngeal Swab; Nasopharyngeal(NP) swabs in vial transport medium  Result Value Ref Range Status   SARS Coronavirus 2 by RT PCR POSITIVE (A) NEGATIVE Final    Comment: RESULT CALLED TO, READ BACK BY AND VERIFIED WITH: REBECCA, RN @ 2131 ON 12/05/20 C VARNER (NOTE) SARS-CoV-2 target nucleic acids are DETECTED.  The SARS-CoV-2 RNA is generally detectable in upper respiratory specimens during the acute phase of infection. Positive results are indicative of the presence of the identified virus, but do not rule out bacterial infection or co-infection with other pathogens not detected by the test. Clinical correlation with patient history and other diagnostic information is necessary to determine patient infection status. The expected result is Negative.  Fact Sheet for Patients: EntrepreneurPulse.com.au  Fact Sheet for Healthcare  Providers: IncredibleEmployment.be  This test is not yet approved or cleared by the [place] FDA and  has been authorized for detection and/or diagnosis of SARS-CoV-2 by FDA under an Emergency Use Authorization (EUA).  This EUA will remain in effect (meaning this test c an be used) for the duration of  the COVID-19 declaration under Section 564(b)(1) of the Act, 21 U.S.C. section 360bbb-3(b)(1), unless the authorization is terminated or revoked sooner.     Influenza A by PCR NEGATIVE NEGATIVE Final   Influenza B by PCR NEGATIVE NEGATIVE Final    Comment: (NOTE) The Xpert Xpress SARS-CoV-2/FLU/RSV plus assay is intended as an aid in the diagnosis of influenza from Nasopharyngeal swab specimens and should not be used as a sole basis for treatment. Nasal washings and aspirates are unacceptable for Xpert Xpress SARS-CoV-2/FLU/RSV testing.  Fact Sheet for Patients: EntrepreneurPulse.com.au  Fact Sheet for Healthcare Providers: IncredibleEmployment.be  This test is not yet approved or cleared by the [place] FDA and has been authorized for detection and/or diagnosis of SARS-CoV-2 by FDA under an Emergency Use Authorization (EUA). This EUA will remain in effect (meaning this test can be used) for the duration of the COVID-19 declaration under Section 564(b)(1) of the Act, 21 U.S.C. section 360bbb-3(b)(1), unless the authorization is terminated or revoked.  Performed at [facility], [place] [address], [place], [place] [address]   Blood Culture (routine x 2)     Status: None (Preliminary result)   Collection Time: 12/05/20 10:25 PM   Specimen: BLOOD  Result Value Ref Range Status   Specimen Description   Final    BLOOD BLOOD RIGHT FOREARM Performed at [place] [address] [place], [place] [address]    Special Requests   Final    BOTTLES DRAWN AEROBIC AND ANAEROBIC Blood  Culture results may not be optimal due to an inadequate volume of blood  received in culture bottles Performed at [facility], [place] [address] [place], [place] [address]    Culture   Final    NO GROWTH 1 DAY Performed at [facility], [place] [address] [place], [place] [address]    Report Status PENDING  Incomplete  Blood Culture (routine x 2)     Status: None (Preliminary result)   Collection Time: 12/06/20  1:10 AM   Specimen: BLOOD  Result Value Ref Range Status   Specimen Description   Final    BLOOD LEFT ARM Performed at [place] [address], [place], [place] [address]    Special Requests   Final    BOTTLES DRAWN AEROBIC AND ANAEROBIC Blood Culture adequate volume Performed at [place] [address] [place], [place] [address]    Culture   Final    NO GROWTH 1 DAY Performed at [facility], [place] [address], [place], [place] [address]    Report Status PENDING  Incomplete     Radiology Studies: DG Abdomen 1 View  Result Date: 12/05/2020 CLINICAL DATA:  Constipation. EXAM: ABDOMEN - 1 VIEW COMPARISON:  Abdominal CT 08/03/2018 FINDINGS: No bowel dilatation to suggest obstruction. No evidence of free air on this portable supine view. Moderate stool in the distal transverse, descending, and sigmoid colon. Mild to moderate rectal distention with stool. Small volume of stool in the more proximal colon. No radiopaque calculi or abnormal soft tissue calcifications. Bones are diffusely under mineralized. IMPRESSION: Moderate colonic stool burden with mild to moderate rectal distention. No bowel obstruction. Electronically Signed   By: Keith Rake M.D.   On: 12/05/2020 19:39   CT Head Wo Contrast  Result Date: 12/05/2020 CLINICAL DATA:  Altered mental status. EXAM: CT HEAD WITHOUT CONTRAST TECHNIQUE: Contiguous axial images were obtained from the base of the skull through the vertex without intravenous  contrast. COMPARISON:  September 12, 2017 FINDINGS: Brain: There is moderate severity cerebral atrophy with widening of the extra-axial spaces and ventricular dilatation. Extra-axial space widening is most prominent within the bilateral frontal regions and is unchanged in appearance when compared to the prior exam. There are areas of decreased attenuation within the white matter tracts of the supratentorial brain, consistent with microvascular disease changes. Vascular: No hyperdense vessel or unexpected calcification. Skull: Normal. Negative for fracture or focal lesion. Sinuses/Orbits: No acute finding. Other: None. IMPRESSION: 1. Generalized cerebral atrophy. 2. No acute intracranial abnormality. Electronically Signed   By: Virgina Norfolk M.D.   On: 12/05/2020 20:38   DG Chest Port 1 View  Result Date: 12/05/2020 CLINICAL DATA:  Cough. EXAM: PORTABLE CHEST 1 VIEW COMPARISON:  08/03/2018 FINDINGS: Upper normal heart size.The cardiomediastinal contours are unchanged. Aortic atherosclerosis. Pulmonary vasculature is normal. No consolidation, pleural effusion, or pneumothorax. No acute osseous abnormalities are seen. Thoracic spondylosis. IMPRESSION: No acute abnormality. Electronically Signed   By: Keith Rake M.D.   On: 12/05/2020 19:38   CT Head Wo Contrast  Final Result    DG Abdomen 1 View  Final Result    DG Chest Port 1 View  Final Result      Scheduled Meds: . vitamin C  500 mg Oral Daily  . divalproex  250 mg Oral BID  . memantine  28 mg Oral QHS   And  . donepezil  10 mg Oral QHS  . dorzolamide-timolol  1 drop Both Eyes TID  . enoxaparin (LOVENOX) injection  40 mg Subcutaneous Q24H  .  insulin aspart  0-15 Units Subcutaneous TID AC & HS  . latanoprost  1 drop Both Eyes QHS  . polyethylene glycol  17 g Oral Daily  . potassium chloride  40 mEq Oral Q4H  . senna-docusate  1 tablet Oral BID  . zinc sulfate  220 mg Oral Daily   PRN Meds: acetaminophen, albuterol,  guaiFENesin-dextromethorphan, ondansetron **OR** ondansetron (ZOFRAN) IV, polyethylene glycol Continuous Infusions:    LOS: 1 day  Time spent: Greater than 50% of the 35 minute visit was spent in counseling/coordination of care for the patient as laid out in the A&P.   Dwyane Dee, MD [facility] 12/07/2020, 1:29 PM

## 2020-12-07 NOTE — Progress Notes (Signed)
SLP Cancellation Note  Patient Details Name: [name] MRN: [id number] DOB: 17-Oct-1924   Cancelled treatment:       Reason Eval/Treat Not Completed: Other (comment) (spoke to RN who reports pt continues to refuse po intake.  Largest barrier is pt declining po not having a significant dysphagia.  Per notes,  MD plans to address goals with family tomorrow. Will sign off, please reorder if desire.  Thanks.)  Kathleen Lime, MS [facility] SLP Acute Rehab Services Office [phone] Pager [phone]    Macario Golds 12/07/2020, 6:08 PM

## 2020-12-07 NOTE — Plan of Care (Signed)

## 2020-12-08 DIAGNOSIS — U071 COVID-19: Secondary | ICD-10-CM | POA: Diagnosis not present

## 2020-12-08 DIAGNOSIS — F0390 Unspecified dementia without behavioral disturbance: Secondary | ICD-10-CM | POA: Diagnosis not present

## 2020-12-08 LAB — PROCALCITONIN: Procalcitonin: <0.1 ng/mL

## 2020-12-08 LAB — D-DIMER, QUANTITATIVE: D-Dimer, Quant: 7.68 ug{FEU}/mL — ABNORMAL HIGH (ref 0.00–0.50)

## 2020-12-08 LAB — CBC WITH DIFFERENTIAL/PLATELET
Abs Immature Granulocytes: 0.02 10*3/uL (ref 0.00–0.07)
Basophils Absolute: 0 10*3/uL (ref 0.0–0.1)
Basophils Relative: 0 %
Eosinophils Absolute: 0 10*3/uL (ref 0.0–0.5)
Eosinophils Relative: 1 %
HCT: 34.4 % — ABNORMAL LOW (ref 36.0–46.0)
Hemoglobin: 10.7 g/dL — ABNORMAL LOW (ref 12.0–15.0)
Immature Granulocytes: 1 %
Lymphocytes Relative: 21 %
Lymphs Abs: 0.9 10*3/uL (ref 0.7–4.0)
MCH: 25.4 pg — ABNORMAL LOW (ref 26.0–34.0)
MCHC: 31.1 g/dL (ref 30.0–36.0)
MCV: 81.7 fL (ref 80.0–100.0)
Monocytes Absolute: 0.5 10*3/uL (ref 0.1–1.0)
Monocytes Relative: 12 %
Neutro Abs: 2.9 10*3/uL (ref 1.7–7.7)
Neutrophils Relative %: 65 %
Platelets: 266 10*3/uL (ref 150–400)
RBC: 4.21 MIL/uL (ref 3.87–5.11)
RDW: 14.5 % (ref 11.5–15.5)
WBC: 4.4 10*3/uL (ref 4.0–10.5)
nRBC: 0 % (ref 0.0–0.2)

## 2020-12-08 LAB — COMPREHENSIVE METABOLIC PANEL WITH GFR
ALT: 11 U/L (ref 0–44)
AST: 18 U/L (ref 15–41)
Albumin: 2.9 g/dL — ABNORMAL LOW (ref 3.5–5.0)
Alkaline Phosphatase: 62 U/L (ref 38–126)
Anion gap: 10 (ref 5–15)
BUN: 8 mg/dL (ref 8–23)
CO2: 27 mmol/L (ref 22–32)
Calcium: 8.6 mg/dL — ABNORMAL LOW (ref 8.9–10.3)
Chloride: 101 mmol/L (ref 98–111)
Creatinine, Ser: 0.43 mg/dL — ABNORMAL LOW (ref 0.44–1.00)
GFR, Estimated: >60 mL/min
Glucose, Bld: 147 mg/dL — ABNORMAL HIGH (ref 70–99)
Potassium: 3 mmol/L — ABNORMAL LOW (ref 3.5–5.1)
Sodium: 138 mmol/L (ref 135–145)
Total Bilirubin: 0.7 mg/dL (ref 0.3–1.2)
Total Protein: 6.5 g/dL (ref 6.5–8.1)

## 2020-12-08 LAB — GLUCOSE, CAPILLARY
Glucose-Capillary: 104 mg/dL — ABNORMAL HIGH (ref 70–99)
Glucose-Capillary: 122 mg/dL — ABNORMAL HIGH (ref 70–99)
Glucose-Capillary: 104 mg/dL — ABNORMAL HIGH (ref 70–99)
Glucose-Capillary: 113 mg/dL — ABNORMAL HIGH (ref 70–99)

## 2020-12-08 LAB — MAGNESIUM: Magnesium: 2.2 mg/dL (ref 1.7–2.4)

## 2020-12-08 LAB — LACTATE DEHYDROGENASE: LDH: 204 U/L — ABNORMAL HIGH (ref 98–192)

## 2020-12-08 LAB — C-REACTIVE PROTEIN: CRP: 6.5 mg/dL — ABNORMAL HIGH

## 2020-12-08 MED ORDER — POTASSIUM CHLORIDE 10 MEQ/100ML IV SOLN
10.0000 meq | INTRAVENOUS | Status: AC
  Administered 2020-12-08 (×5): 10 meq via INTRAVENOUS
  Filled 2020-12-08 (×5): qty 100

## 2020-12-08 MED ORDER — POTASSIUM CHLORIDE 10 MEQ/100ML IV SOLN
10.0000 meq | Freq: Once | INTRAVENOUS | Status: AC
  Administered 2020-12-08: 10 meq via INTRAVENOUS
  Filled 2020-12-08: qty 100

## 2020-12-08 NOTE — Plan of Care (Signed)
  Problem: Clinical Measurements: Goal: Respiratory complications will improve Outcome: Adequate for Discharge   

## 2020-12-08 NOTE — Progress Notes (Signed)
PROGRESS NOTE    [name]   GEX:[id number]  DOB: August 13, 1924  DOA: 12/05/2020     2  PCP: Merrilee Seashore, MD  CC: weakness  Hospital Course: Ms. [name] is a 87 yo female with PMH dementia, HTN, DMII, GERD who presented to the hospital with reports of worsening constipation and decreased oral intake.  Due to her dementia, collateral information was obtained from family on admission.  In addition to her constipation lethargy she also had a reported dry cough.  She also had known exposure to family members with COVID-19.  On work-up, she was found to be positive for COVID-19.  She was clinically dehydrated and placed on IV fluids.  She was started on a bowel regimen for her constipation.  She was not found to be hypoxic however did have symptoms considered to be contributed to COVID-19 and was therefore started on a 3-day course of remdesivir.   Interval History:  No events overnight.  Resting in bed this morning.  Mentation remains at baseline in regards to underlying dementia.  Old records reviewed in assessment of this patient  ROS: Review of systems not obtained due to patient factors. Cognitive impairment   Assessment & Plan: * COVID-19 virus infection -Patient had 1 week history of reported symptoms including lethargy, confusion, dry cough -No hypoxia or respiratory symptoms.  Patient completed on 3-day course of remdesivir -No indication for steroids -Continue zinc and vitamin C -Continue breathing treatments as needed  Hypoglycemia-resolved as of 12/08/2020 -Hypoglycemic on 2/16; resolved s/p D5 infusion -Etiology considered due to poor oral intake recently  Type 2 diabetes mellitus with hyperglycemia, without long-term current use of insulin (HCC) - continue SSI and CBG monitoring   Pressure ulcer, buttock - stage II per WOC, appreciate assistance - continue foam dressing and turning per protocol   Constipation -Stool burden appreciated on abdominal  x-ray on admission -Continue bowel regimen  Dementia without behavioral disturbance ([place]) -Conversing well still in what appears to be her baseline state of dementia  Acute metabolic encephalopathy-resolved as of 12/06/2020 - patient symptoms include lethargy - etiology considered due to metabolic abnormalities from dehydration - mentation appears back to normal dementia baseline on 2/16   Antimicrobials:   DVT prophylaxis: enoxaparin (LOVENOX) injection 40 mg Start: 12/06/20 1000   Code Status:   Code Status: Full Code Family Communication:   Disposition Plan: Status is: Inpatient  Remains inpatient appropriate because:IV treatments appropriate due to intensity of illness or inability to take PO and Inpatient level of care appropriate due to severity of illness   Dispo: The patient is from: Home              Anticipated d/c is to: Home              Anticipated d/c date is: Saturday              Patient currently is not medically stable to d/c.   Difficult to place patient No  Risk of unplanned readmission score: Unplanned Admission- Pilot do not use: 11.31   Objective: Blood pressure (!) 147/73, pulse 75, temperature 98.6 F (37 C), temperature source Oral, resp. rate 18, height 5\' 3"  (1.6 m), weight 65.2 kg, SpO2 100 %.  Examination: General appearance: dementia noted; pleasant, cooperative, NAD Head: Normocephalic, without obvious abnormality, atraumatic Eyes: EOMI Lungs: clear to auscultation bilaterally Heart: regular rate and rhythm and S1, S2 normal Abdomen: normal findings: bowel sounds normal and soft, non-tender Extremities: no edema Skin:  mobility and turgor normal Neurologic: moves all 4 extremities, follows commands  Consultants:     Procedures:     Data Reviewed: I have personally reviewed following labs and imaging studies Results for orders placed or performed during the hospital encounter of 12/05/20 (from the past 24 hour(s))  Glucose,  capillary     Status: Abnormal   Collection Time: 12/07/20  5:18 PM  Result Value Ref Range   Glucose-Capillary 123 (H) 70 - 99 mg/dL  Glucose, capillary     Status: Abnormal   Collection Time: 12/07/20  8:46 PM  Result Value Ref Range   Glucose-Capillary 164 (H) 70 - 99 mg/dL  CBC with Differential/Platelet     Status: Abnormal   Collection Time: 12/08/20  3:52 AM  Result Value Ref Range   WBC 4.4 4.0 - 10.5 K/uL   RBC 4.21 3.87 - 5.11 MIL/uL   Hemoglobin 10.7 (L) 12.0 - 15.0 g/dL   HCT 34.4 (L) 36.0 - 46.0 %   MCV 81.7 80.0 - 100.0 fL   MCH 25.4 (L) 26.0 - 34.0 pg   MCHC 31.1 30.0 - 36.0 g/dL   RDW 14.5 11.5 - 15.5 %   Platelets 266 150 - 400 K/uL   nRBC 0.0 0.0 - 0.2 %   Neutrophils Relative % 65 %   Neutro Abs 2.9 1.7 - 7.7 K/uL   Lymphocytes Relative 21 %   Lymphs Abs 0.9 0.7 - 4.0 K/uL   Monocytes Relative 12 %   Monocytes Absolute 0.5 0.1 - 1.0 K/uL   Eosinophils Relative 1 %   Eosinophils Absolute 0.0 0.0 - 0.5 K/uL   Basophils Relative 0 %   Basophils Absolute 0.0 0.0 - 0.1 K/uL   Immature Granulocytes 1 %   Abs Immature Granulocytes 0.02 0.00 - 0.07 K/uL  Comprehensive metabolic panel     Status: Abnormal   Collection Time: 12/08/20  3:52 AM  Result Value Ref Range   Sodium 138 135 - 145 mmol/L   Potassium 3.0 (L) 3.5 - 5.1 mmol/L   Chloride 101 98 - 111 mmol/L   CO2 27 22 - 32 mmol/L   Glucose, Bld 147 (H) 70 - 99 mg/dL   BUN 8 8 - 23 mg/dL   Creatinine, Ser 0.43 (L) 0.44 - 1.00 mg/dL   Calcium 8.6 (L) 8.9 - 10.3 mg/dL   Total Protein 6.5 6.5 - 8.1 g/dL   Albumin 2.9 (L) 3.5 - 5.0 g/dL   AST 18 15 - 41 U/L   ALT 11 0 - 44 U/L   Alkaline Phosphatase 62 38 - 126 U/L   Total Bilirubin 0.7 0.3 - 1.2 mg/dL   GFR, Estimated >60 >60 mL/min   Anion gap 10 5 - 15  C-reactive protein     Status: Abnormal   Collection Time: 12/08/20  3:52 AM  Result Value Ref Range   CRP 6.5 (H) <1.0 mg/dL  D-dimer, quantitative (not at [facility])     Status: Abnormal   Collection  Time: 12/08/20  3:52 AM  Result Value Ref Range   D-Dimer, Quant 7.68 (H) 0.00 - 0.50 ug/mL-FEU  Magnesium     Status: None   Collection Time: 12/08/20  3:52 AM  Result Value Ref Range   Magnesium 2.2 1.7 - 2.4 mg/dL  Lactate dehydrogenase     Status: Abnormal   Collection Time: 12/08/20  3:52 AM  Result Value Ref Range   LDH 204 (H) 98 - 192 U/L  Procalcitonin  Status: None   Collection Time: 12/08/20  3:52 AM  Result Value Ref Range   Procalcitonin <0.10 ng/mL  Glucose, capillary     Status: Abnormal   Collection Time: 12/08/20  7:27 AM  Result Value Ref Range   Glucose-Capillary 104 (H) 70 - 99 mg/dL    Recent Results (from the past 240 hour(s))  Resp Panel by RT-PCR (Flu A&B, Covid) Nasopharyngeal Swab     Status: Abnormal   Collection Time: 12/05/20  7:07 PM   Specimen: Nasopharyngeal Swab; Nasopharyngeal(NP) swabs in vial transport medium  Result Value Ref Range Status   SARS Coronavirus 2 by RT PCR POSITIVE (A) NEGATIVE Final    Comment: RESULT CALLED TO, READ BACK BY AND VERIFIED WITH: REBECCA, RN @ 2131 ON 12/05/20 C VARNER (NOTE) SARS-CoV-2 target nucleic acids are DETECTED.  The SARS-CoV-2 RNA is generally detectable in upper respiratory specimens during the acute phase of infection. Positive results are indicative of the presence of the identified virus, but do not rule out bacterial infection or co-infection with other pathogens not detected by the test. Clinical correlation with patient history and other diagnostic information is necessary to determine patient infection status. The expected result is Negative.  Fact Sheet for Patients: EntrepreneurPulse.com.au  Fact Sheet for Healthcare Providers: IncredibleEmployment.be  This test is not yet approved or cleared by the [place] FDA and  has been authorized for detection and/or diagnosis of SARS-CoV-2 by FDA under an Emergency Use Authorization (EUA).  This EUA  will remain in effect (meaning this test c an be used) for the duration of  the COVID-19 declaration under Section 564(b)(1) of the Act, 21 U.S.C. section 360bbb-3(b)(1), unless the authorization is terminated or revoked sooner.     Influenza A by PCR NEGATIVE NEGATIVE Final   Influenza B by PCR NEGATIVE NEGATIVE Final    Comment: (NOTE) The Xpert Xpress SARS-CoV-2/FLU/RSV plus assay is intended as an aid in the diagnosis of influenza from Nasopharyngeal swab specimens and should not be used as a sole basis for treatment. Nasal washings and aspirates are unacceptable for Xpert Xpress SARS-CoV-2/FLU/RSV testing.  Fact Sheet for Patients: EntrepreneurPulse.com.au  Fact Sheet for Healthcare Providers: IncredibleEmployment.be  This test is not yet approved or cleared by the [place] FDA and has been authorized for detection and/or diagnosis of SARS-CoV-2 by FDA under an Emergency Use Authorization (EUA). This EUA will remain in effect (meaning this test can be used) for the duration of the COVID-19 declaration under Section 564(b)(1) of the Act, 21 U.S.C. section 360bbb-3(b)(1), unless the authorization is terminated or revoked.  Performed at [facility], [place] [address], [place], [place] [address]   Blood Culture (routine x 2)     Status: None (Preliminary result)   Collection Time: 12/05/20 10:25 PM   Specimen: BLOOD  Result Value Ref Range Status   Specimen Description   Final    BLOOD BLOOD RIGHT FOREARM Performed at [facility] [address], [place], [place] [address]    Special Requests   Final    BOTTLES DRAWN AEROBIC AND ANAEROBIC Blood Culture results may not be optimal due to an inadequate volume of blood received in culture bottles Performed at [facility] [address] [place], [place] [address]    Culture   Final    NO GROWTH 1 DAY Performed at [facility], [place] [address] [place],  [address]    Report Status PENDING  Incomplete  Blood Culture (routine x 2)     Status: None (Preliminary result)   Collection Time: 12/06/20  1:10 AM   Specimen: BLOOD  Result Value Ref Range Status   Specimen Description   Final    BLOOD LEFT ARM Performed at [facility] [address], [place], [place] [address]    Special Requests   Final    BOTTLES DRAWN AEROBIC AND ANAEROBIC Blood Culture adequate volume Performed at [facility] [address] [place], [place] [address]    Culture   Final    NO GROWTH 1 DAY Performed at [facility], [place] [address] [place], [place] [address]    Report Status PENDING  Incomplete     Radiology Studies: No results found. CT Head Wo Contrast  Final Result    DG Abdomen 1 View  Final Result    DG Chest Port 1 View  Final Result      Scheduled Meds: . vitamin C  500 mg Oral Daily  . divalproex  250 mg Oral BID  . memantine  28 mg Oral QHS   And  . donepezil  10 mg Oral QHS  . dorzolamide-timolol  1 drop Both Eyes TID  . enoxaparin (LOVENOX) injection  40 mg Subcutaneous Q24H  . insulin aspart  0-15 Units Subcutaneous TID AC & HS  . latanoprost  1 drop Both Eyes QHS  . polyethylene glycol  17 g Oral Daily  . senna-docusate  1 tablet Oral BID  . zinc sulfate  220 mg Oral Daily   PRN Meds: acetaminophen, albuterol, guaiFENesin-dextromethorphan, hydrALAZINE, labetalol, ondansetron **OR** ondansetron (ZOFRAN) IV, polyethylene glycol Continuous Infusions: . potassium chloride 10 mEq (12/08/20 0933)     LOS: 2 days  Time spent: Greater than 50% of the 35 minute visit was spent in counseling/coordination of care for the patient as laid out in the A&P.   Dwyane Dee, MD [facility] 12/08/2020, 11:57 AM

## 2020-12-08 NOTE — Care Management Important Message (Signed)
Important Message  Patient Details IM Letter placed in Patient's door caddy. Name: [name] MRN: [id number] Date of Birth: [birth year]   Medicare Important Message Given:  Yes     Kerin Salen 12/08/2020, 12:43 PM

## 2020-12-09 DIAGNOSIS — G9341 Metabolic encephalopathy: Secondary | ICD-10-CM | POA: Diagnosis not present

## 2020-12-09 DIAGNOSIS — K5900 Constipation, unspecified: Secondary | ICD-10-CM | POA: Diagnosis not present

## 2020-12-09 DIAGNOSIS — U071 COVID-19: Secondary | ICD-10-CM | POA: Diagnosis not present

## 2020-12-09 DIAGNOSIS — F0390 Unspecified dementia without behavioral disturbance: Secondary | ICD-10-CM | POA: Diagnosis not present

## 2020-12-09 LAB — COMPREHENSIVE METABOLIC PANEL
ALT: 10 U/L (ref 0–44)
AST: 15 U/L (ref 15–41)
Albumin: 2.6 g/dL — ABNORMAL LOW (ref 3.5–5.0)
Alkaline Phosphatase: 59 U/L (ref 38–126)
Anion gap: 11 (ref 5–15)
BUN: 12 mg/dL (ref 8–23)
CO2: 23 mmol/L (ref 22–32)
Calcium: 8 mg/dL — ABNORMAL LOW (ref 8.9–10.3)
Chloride: 103 mmol/L (ref 98–111)
Creatinine, Ser: 0.62 mg/dL (ref 0.44–1.00)
GFR, Estimated: >60 mL/min (ref >60)
Glucose, Bld: 94 mg/dL (ref 70–99)
Potassium: 3.9 mmol/L (ref 3.5–5.1)
Sodium: 137 mmol/L (ref 135–145)
Total Bilirubin: 0.8 mg/dL (ref 0.3–1.2)
Total Protein: 5.9 g/dL — ABNORMAL LOW (ref 6.5–8.1)

## 2020-12-09 LAB — CBC WITH DIFFERENTIAL/PLATELET
Abs Immature Granulocytes: 0.03 10*3/uL (ref 0.00–0.07)
Basophils Absolute: 0 10*3/uL (ref 0.0–0.1)
Basophils Relative: 0 %
Eosinophils Absolute: 0 10*3/uL (ref 0.0–0.5)
Eosinophils Relative: 1 %
HCT: 32 % — ABNORMAL LOW (ref 36.0–46.0)
Hemoglobin: 9.7 g/dL — ABNORMAL LOW (ref 12.0–15.0)
Immature Granulocytes: 1 %
Lymphocytes Relative: 32 %
Lymphs Abs: 1.3 10*3/uL (ref 0.7–4.0)
MCH: 26 pg (ref 26.0–34.0)
MCHC: 30.3 g/dL (ref 30.0–36.0)
MCV: 85.8 fL (ref 80.0–100.0)
Monocytes Absolute: 0.6 10*3/uL (ref 0.1–1.0)
Monocytes Relative: 16 %
Neutro Abs: 2 10*3/uL (ref 1.7–7.7)
Neutrophils Relative %: 50 %
Platelets: 281 10*3/uL (ref 150–400)
RBC: 3.73 MIL/uL — ABNORMAL LOW (ref 3.87–5.11)
RDW: 14.8 % (ref 11.5–15.5)
WBC: 4 10*3/uL (ref 4.0–10.5)
nRBC: 0 % (ref 0.0–0.2)

## 2020-12-09 LAB — C-REACTIVE PROTEIN: CRP: 6.1 mg/dL — ABNORMAL HIGH (ref <1.0)

## 2020-12-09 LAB — MAGNESIUM: Magnesium: 2.1 mg/dL (ref 1.7–2.4)

## 2020-12-09 LAB — D-DIMER, QUANTITATIVE: D-Dimer, Quant: 5.56 ug/mL-FEU — ABNORMAL HIGH (ref 0.00–0.50)

## 2020-12-09 LAB — LACTATE DEHYDROGENASE: LDH: 193 U/L — ABNORMAL HIGH (ref 98–192)

## 2020-12-09 LAB — GLUCOSE, CAPILLARY: Glucose-Capillary: 79 mg/dL (ref 70–99)

## 2020-12-09 NOTE — Discharge Summary (Signed)
Physician Discharge Summary   [name] BZJ:[id number] DOB: Mar 06, 1924 DOA: 12/05/2020  PCP: Traci Seashore, MD  Admit date: 12/05/2020 Discharge date: 12/09/2020  Admitted From: home Disposition:  home Discharging physician: Traci Dee, MD  Recommendations for Outpatient Follow-up:  1. Consider placement in SNF if family unable to continue caring for patient in future; discussed with son prior to d/c that she has high risk for further failure to thrive given underlying dementia that appears to be more progressive lately  2. Would benefit from further code status discussions   Patient discharged to home in Discharge Condition: stable Risk of unplanned readmission score: Unplanned Admission- Pilot do not use: 11.39  CODE STATUS: Full Diet recommendation:  Diet Orders (From admission, onward)    Start     Ordered   12/06/20 1732  DIET DYS 2 Room service appropriate? Yes; Fluid consistency: Thin  Diet effective now       Question Answer Comment  Room service appropriate? Yes   Fluid consistency: Thin      12/06/20 1732          Hospital Course: [name] is a 87 yo female with PMH dementia, HTN, DMII, GERD who presented to the hospital with reports of worsening constipation and decreased oral intake.  Due to her dementia, collateral information was obtained from family on admission.  In addition to her constipation lethargy she also had a reported dry cough.  She also had known exposure to family members with COVID-19.  On work-up, she was found to be positive for COVID-19.  She was clinically dehydrated and placed on IV fluids.  She was started on a bowel regimen for her constipation.  She was not found to be hypoxic however did have symptoms considered to be contributed to COVID-19 and was therefore started on a 3-day course of remdesivir.  She responded well to treatment in general and mentation returned back to her normal baseline of dementia after fluid  resuscitation. Multiple discussions were held with family on the phone regarding her underlying dementia and generalized failure to thrive.  She was offered placement in SNF/rehab however family declined.  Discussed that her underlying dementia will predispose her to likely further failure to thrive in the future and recurrent dehydration/malnutrition. The patient was considered stable at time of discharge and back to previous baseline.  This was discussed with her son and transportation was arranged home at time of discharge.   * COVID-19 virus infection -Patient had 1 week history of reported symptoms including lethargy, confusion, dry cough -No hypoxia or respiratory symptoms.  Patient completed on 3-day course of remdesivir -No indication for steroids  Hypoglycemia-resolved as of 12/08/2020 -Hypoglycemic on 2/16; resolved s/p D5 infusion -Etiology considered due to poor oral intake recently  Type 2 diabetes mellitus with hyperglycemia, without long-term current use of insulin (HCC) -Treated with SSI and CBG monitoring in hospital  Pressure ulcer, buttock - stage II per WOC, appreciate assistance -Recommended for frequent turning at home upon discharge  Constipation -Stool burden appreciated on abdominal x-ray on admission - given laxatives in hospital   Dementia without behavioral disturbance ([place]) -Conversing well still in what appears to be her baseline state of dementia -Discussed with family the prognosis associated with dementia as well as the progressive course. -Suspect she will likely require further hospitalizations with recurrent failure to thrive.  SNF offered to family however declining at this time  Acute metabolic encephalopathy-resolved as of 12/06/2020 - patient symptoms include lethargy - etiology  considered due to metabolic abnormalities from dehydration - mentation appears back to normal dementia baseline on 2/16     The patient's chronic medical conditions  were treated accordingly per the patient's home medication regimen except as noted.  On day of discharge, patient was felt deemed stable for discharge. Patient/family member advised to call PCP or come back to ER if needed.   Principal Diagnosis: COVID-19 virus infection  Discharge Diagnoses: Active Hospital Problems   Diagnosis Date Noted  . COVID-19 virus infection 12/05/2020    Priority: High  . Constipation 12/05/2020  . Pressure ulcer, buttock 12/05/2020  . Type 2 diabetes mellitus with hyperglycemia, without long-term current use of insulin ([place]) 12/05/2020  . GERD without esophagitis   . Dementia without behavioral disturbance ([place]) 09/12/2017    Resolved Hospital Problems   Diagnosis Date Noted Date Resolved  . Hypoglycemia 12/06/2020 12/08/2020    Priority: Medium  . Acute metabolic encephalopathy 95/28/4132 12/06/2020    Discharge Instructions    Discharge wound care:   Complete by: As directed    Encourage constant turning in bed and getting out of bed as able   Increase activity slowly   Complete by: As directed      Allergies as of 12/09/2020      Reactions   Lisinopril    Other reaction(s): cough   Prochlorperazine    Other reaction(s): Unknown   Sulfamethoxazole-trimethoprim    Other reaction(s): rash      Medication List    STOP taking these medications   brompheniramine-pseudoephedrine-DM 30-2-10 MG/5ML syrup   celecoxib 200 MG capsule Commonly known as: CELEBREX     TAKE these medications   acetaminophen 650 MG CR tablet Commonly known as: TYLENOL Take 650 mg by mouth daily as needed for pain.   divalproex 250 MG DR tablet Commonly known as: DEPAKOTE Take 250 mg by mouth 2 (two) times daily.   dorzolamide-timolol 22.3-6.8 MG/ML ophthalmic solution Commonly known as: COSOPT Place 1 drop into both eyes 3 (three) times daily.   glipiZIDE 5 MG tablet Commonly known as: GLUCOTROL Take 5 mg by mouth daily before breakfast.   Memantine  HCl-Donepezil HCl 28-10 MG Cp24 Take 1 capsule by mouth daily.   travoprost (benzalkonium) 0.004 % ophthalmic solution Commonly known as: TRAVATAN Place 1 drop into both eyes at bedtime.            Discharge Care Instructions  (From admission, onward)         Start     Ordered   12/09/20 0000  Discharge wound care:       Comments: Encourage constant turning in bed and getting out of bed as able   12/09/20 1116          Allergies  Allergen Reactions  . Lisinopril     Other reaction(s): cough  . Prochlorperazine     Other reaction(s): Unknown  . Sulfamethoxazole-Trimethoprim     Other reaction(s): rash    Consultations:   Discharge Exam: BP (!) 159/73   Pulse 66   Temp (!) 97.4 F (36.3 C) (Axillary)   Resp 16   Ht 5\' 3"  (1.6 m)   Wt 65.2 kg   SpO2 100%   BMI 25.46 kg/m  General appearance: dementia noted; pleasant, cooperative, NAD Head: Normocephalic, without obvious abnormality, atraumatic Eyes: EOMI Lungs: clear to auscultation bilaterally Heart: regular rate and rhythm and S1, S2 normal Abdomen: normal findings: bowel sounds normal and soft, non-tender Extremities: no edema Skin: mobility and  turgor normal Neurologic: moves all 4 extremities, follows commands  The results of significant diagnostics from this hospitalization (including imaging, microbiology, ancillary and laboratory) are listed below for reference.   Microbiology: Recent Results (from the past 240 hour(s))  Resp Panel by RT-PCR (Flu A&B, Covid) Nasopharyngeal Swab     Status: Abnormal   Collection Time: 12/05/20  7:07 PM   Specimen: Nasopharyngeal Swab; Nasopharyngeal(NP) swabs in vial transport medium  Result Value Ref Range Status   SARS Coronavirus 2 by RT PCR POSITIVE (A) NEGATIVE Final    Comment: RESULT CALLED TO, READ BACK BY AND VERIFIED WITH: REBECCA, RN @ 2131 ON 12/05/20 C VARNER (NOTE) SARS-CoV-2 target nucleic acids are DETECTED.  The SARS-CoV-2 RNA is generally  detectable in upper respiratory specimens during the acute phase of infection. Positive results are indicative of the presence of the identified virus, but do not rule out bacterial infection or co-infection with other pathogens not detected by the test. Clinical correlation with patient history and other diagnostic information is necessary to determine patient infection status. The expected result is Negative.  Fact Sheet for Patients: EntrepreneurPulse.com.au  Fact Sheet for Healthcare Providers: IncredibleEmployment.be  This test is not yet approved or cleared by the [place] FDA and  has been authorized for detection and/or diagnosis of SARS-CoV-2 by FDA under an Emergency Use Authorization (EUA).  This EUA will remain in effect (meaning this test c an be used) for the duration of  the COVID-19 declaration under Section 564(b)(1) of the Act, 21 U.S.C. section 360bbb-3(b)(1), unless the authorization is terminated or revoked sooner.     Influenza A by PCR NEGATIVE NEGATIVE Final   Influenza B by PCR NEGATIVE NEGATIVE Final    Comment: (NOTE) The Xpert Xpress SARS-CoV-2/FLU/RSV plus assay is intended as an aid in the diagnosis of influenza from Nasopharyngeal swab specimens and should not be used as a sole basis for treatment. Nasal washings and aspirates are unacceptable for Xpert Xpress SARS-CoV-2/FLU/RSV testing.  Fact Sheet for Patients: EntrepreneurPulse.com.au  Fact Sheet for Healthcare Providers: IncredibleEmployment.be  This test is not yet approved or cleared by the [place] FDA and has been authorized for detection and/or diagnosis of SARS-CoV-2 by FDA under an Emergency Use Authorization (EUA). This EUA will remain in effect (meaning this test can be used) for the duration of the COVID-19 declaration under Section 564(b)(1) of the Act, 21 U.S.C. section 360bbb-3(b)(1), unless the  authorization is terminated or revoked.  Performed at [facility], [place] [address], [place], [place] [address]   Blood Culture (routine x 2)     Status: None (Preliminary result)   Collection Time: 12/05/20 10:25 PM   Specimen: BLOOD  Result Value Ref Range Status   Specimen Description   Final    BLOOD BLOOD RIGHT FOREARM Performed at [facility] [address], [place], [place] [address]    Special Requests   Final    BOTTLES DRAWN AEROBIC AND ANAEROBIC Blood Culture results may not be optimal due to an inadequate volume of blood received in culture bottles Performed at [facility] [address], [place], [place] [address]    Culture   Final    NO GROWTH 3 DAYS Performed at [facility], [place] [address] [place],  [address]    Report Status PENDING  Incomplete  Blood Culture (routine x 2)     Status: None (Preliminary result)   Collection Time: 12/06/20  1:10 AM  Specimen: BLOOD  Result Value Ref Range Status   Specimen Description   Final    BLOOD LEFT ARM Performed at [facility] [address] [place], [place] [address]    Special Requests   Final    BOTTLES DRAWN AEROBIC AND ANAEROBIC Blood Culture adequate volume Performed at [facility] [address] [place], [place] [address]    Culture   Final    NO GROWTH 3 DAYS Performed at [facility], [place] [address] [place],  [address]    Report Status PENDING  Incomplete     Labs: BNP (last 3 results) No results for input(s): BNP in the last 8760 hours. Basic Metabolic Panel: Recent Labs  Lab 12/05/20 1907 12/06/20 0110 12/07/20 0408 12/08/20 0352 12/09/20 0122  NA 143 141 140 138 137  K 3.6 3.7 3.1* 3.0* 3.9  CL 106 105 102 101 103  CO2 26 24 26 27 23   GLUCOSE 134* 141* 96 147* 94  BUN 16 15 9 8 12   CREATININE 0.61 0.55 0.44 0.43* 0.62  CALCIUM 8.6* 8.3* 8.2* 8.6* 8.0*  MG   --  1.9 1.5* 2.2 2.1   Liver Function Tests: Recent Labs  Lab 12/05/20 1907 12/06/20 0110 12/07/20 0408 12/08/20 0352 12/09/20 0122  AST 20 17 17 18 15   ALT 14 11 11 11 10   ALKPHOS 62 53 54 62 59  BILITOT 0.8 0.8 0.7 0.7 0.8  PROT 7.2 6.7 6.2* 6.5 5.9*  ALBUMIN 3.2* 3.0* 2.6* 2.9* 2.6*   No results for input(s): LIPASE, AMYLASE in the last 168 hours. No results for input(s): AMMONIA in the last 168 hours. CBC: Recent Labs  Lab 12/05/20 1907 12/06/20 0110 12/07/20 0408 12/08/20 0352 12/09/20 0122  WBC 4.3 4.3 3.3* 4.4 4.0  NEUTROABS 2.6 2.7 1.8 2.9 2.0  HGB 10.2* 9.9* 10.1* 10.7* 9.7*  HCT 33.8* 33.1* 33.4* 34.4* 32.0*  MCV 85.1 86.2 84.3 81.7 85.8  PLT 250 254 252 266 281   Cardiac Enzymes: No results for input(s): CKTOTAL, CKMB, CKMBINDEX, TROPONINI in the last 168 hours. BNP: Invalid input(s): POCBNP CBG: Recent Labs  Lab 12/08/20 0727 12/08/20 1202 12/08/20 1738 12/08/20 2107 12/09/20 1011  GLUCAP 104* 122* 104* 113* 79   D-Dimer Recent Labs    12/08/20 0352 12/09/20 0122  DDIMER 7.68* 5.56*   Hgb A1c No results for input(s): HGBA1C in the last 72 hours. Lipid Profile No results for input(s): CHOL, HDL, LDLCALC, TRIG, CHOLHDL, LDLDIRECT in the last 72 hours. Thyroid function studies No results for input(s): TSH, T4TOTAL, T3FREE, THYROIDAB in the last 72 hours.  Invalid input(s): FREET3 Anemia work up No results for input(s): VITAMINB12, FOLATE, FERRITIN, TIBC, IRON, RETICCTPCT in the last 72 hours. Urinalysis    Component Value Date/Time   COLORURINE YELLOW 08/04/2018 0220   APPEARANCEUR CLEAR 08/04/2018 0220   LABSPEC 1.030 08/04/2018 0220   PHURINE 7.0 08/04/2018 0220   GLUCOSEU NEGATIVE 08/04/2018 0220   HGBUR SMALL (A) 08/04/2018 0220   BILIRUBINUR NEGATIVE 08/04/2018 0220   KETONESUR 20 (A) 08/04/2018 0220   PROTEINUR NEGATIVE 08/04/2018 0220   UROBILINOGEN 1.0 03/22/2011 1714   NITRITE NEGATIVE 08/04/2018 0220   LEUKOCYTESUR  NEGATIVE 08/04/2018 0220   Sepsis Labs Invalid input(s): PROCALCITONIN,  WBC,  LACTICIDVEN Microbiology Recent Results (from the past 240 hour(s))  Resp Panel by RT-PCR (Flu A&B, Covid) Nasopharyngeal Swab     Status: Abnormal   Collection Time: 12/05/20  7:07 PM  Specimen: Nasopharyngeal Swab; Nasopharyngeal(NP) swabs in vial transport medium  Result Value Ref Range Status   SARS Coronavirus 2 by RT PCR POSITIVE (A) NEGATIVE Final    Comment: RESULT CALLED TO, READ BACK BY AND VERIFIED WITH: REBECCA, RN @ 2131 ON 12/05/20 C VARNER (NOTE) SARS-CoV-2 target nucleic acids are DETECTED.  The SARS-CoV-2 RNA is generally detectable in upper respiratory specimens during the acute phase of infection. Positive results are indicative of the presence of the identified virus, but do not rule out bacterial infection or co-infection with other pathogens not detected by the test. Clinical correlation with patient history and other diagnostic information is necessary to determine patient infection status. The expected result is Negative.  Fact Sheet for Patients: EntrepreneurPulse.com.au  Fact Sheet for Healthcare Providers: IncredibleEmployment.be  This test is not yet approved or cleared by the [place] FDA and  has been authorized for detection and/or diagnosis of SARS-CoV-2 by FDA under an Emergency Use Authorization (EUA).  This EUA will remain in effect (meaning this test c an be used) for the duration of  the COVID-19 declaration under Section 564(b)(1) of the Act, 21 U.S.C. section 360bbb-3(b)(1), unless the authorization is terminated or revoked sooner.     Influenza A by PCR NEGATIVE NEGATIVE Final   Influenza B by PCR NEGATIVE NEGATIVE Final    Comment: (NOTE) The Xpert Xpress SARS-CoV-2/FLU/RSV plus assay is intended as an aid in the diagnosis of influenza from Nasopharyngeal swab specimens and should not be used as a sole basis for  treatment. Nasal washings and aspirates are unacceptable for Xpert Xpress SARS-CoV-2/FLU/RSV testing.  Fact Sheet for Patients: EntrepreneurPulse.com.au  Fact Sheet for Healthcare Providers: IncredibleEmployment.be  This test is not yet approved or cleared by the [place] FDA and has been authorized for detection and/or diagnosis of SARS-CoV-2 by FDA under an Emergency Use Authorization (EUA). This EUA will remain in effect (meaning this test can be used) for the duration of the COVID-19 declaration under Section 564(b)(1) of the Act, 21 U.S.C. section 360bbb-3(b)(1), unless the authorization is terminated or revoked.  Performed at [facility], [place] [address] [place], [place] [address]   Blood Culture (routine x 2)     Status: None (Preliminary result)   Collection Time: 12/05/20 10:25 PM   Specimen: BLOOD  Result Value Ref Range Status   Specimen Description   Final    BLOOD BLOOD RIGHT FOREARM Performed at [facility] [address], [place], [place] [address]    Special Requests   Final    BOTTLES DRAWN AEROBIC AND ANAEROBIC Blood Culture results may not be optimal due to an inadequate volume of blood received in culture bottles Performed at [facility] [address] [place], [place] [address]    Culture   Final    NO GROWTH 3 DAYS Performed at [facility], [place] [address] [place], [place] [address]    Report Status PENDING  Incomplete  Blood Culture (routine x 2)     Status: None (Preliminary result)   Collection Time: 12/06/20  1:10 AM   Specimen: BLOOD  Result Value Ref Range Status   Specimen Description   Final    BLOOD LEFT ARM Performed at [facility] [address] [place], [place] [address]    Special Requests   Final    BOTTLES DRAWN AEROBIC AND ANAEROBIC Blood Culture adequate volume Performed at [facility] [address]  [place], [place] [address]    Culture   Final    NO GROWTH 3 DAYS Performed at [facility], [place] [address] [place], [place] [address]    Report Status PENDING  Incomplete    Procedures/Studies: DG Abdomen 1 View  Result Date: 12/05/2020 CLINICAL DATA:  Constipation. EXAM: ABDOMEN - 1 VIEW COMPARISON:  Abdominal CT 08/03/2018 FINDINGS: No bowel dilatation to suggest obstruction. No evidence of free air on this portable supine view. Moderate stool in the distal transverse, descending, and sigmoid colon. Mild to moderate rectal distention with stool. Small volume of stool in the more proximal colon. No radiopaque calculi or abnormal soft tissue calcifications. Bones are diffusely under mineralized. IMPRESSION: Moderate colonic stool burden with mild to moderate rectal distention. No bowel obstruction. Electronically Signed   By: Keith Rake M.D.   On: 12/05/2020 19:39   CT Head Wo Contrast  Result Date: 12/05/2020 CLINICAL DATA:  Altered mental status. EXAM: CT HEAD WITHOUT CONTRAST TECHNIQUE: Contiguous axial images were obtained from the base of the skull through the vertex without intravenous contrast. COMPARISON:  September 12, 2017 FINDINGS: Brain: There is moderate severity cerebral atrophy with widening of the extra-axial spaces and ventricular dilatation. Extra-axial space widening is most prominent within the bilateral frontal regions and is unchanged in appearance when compared to the prior exam. There are areas of decreased attenuation within the white matter tracts of the supratentorial brain, consistent with microvascular disease changes. Vascular: No hyperdense vessel or unexpected calcification. Skull: Normal. Negative for fracture or focal lesion. Sinuses/Orbits: No acute finding. Other: None. IMPRESSION: 1. Generalized cerebral atrophy. 2. No acute intracranial abnormality. Electronically Signed   By: Virgina Norfolk M.D.   On: 12/05/2020 20:38    DG Chest Port 1 View  Result Date: 12/05/2020 CLINICAL DATA:  Cough. EXAM: PORTABLE CHEST 1 VIEW COMPARISON:  08/03/2018 FINDINGS: Upper normal heart size.The cardiomediastinal contours are unchanged. Aortic atherosclerosis. Pulmonary vasculature is normal. No consolidation, pleural effusion, or pneumothorax. No acute osseous abnormalities are seen. Thoracic spondylosis. IMPRESSION: No acute abnormality. Electronically Signed   By: Keith Rake M.D.   On: 12/05/2020 19:38     Time coordinating discharge: Over 30 minutes    Traci Dee, MD  [facility] 12/09/2020, 1:20 PM

## 2020-12-09 NOTE — Progress Notes (Signed)
Called and updated pt son Marcello Moores that Corey Harold has left and pt on the way home.

## 2020-12-09 NOTE — Progress Notes (Signed)
Physical Therapy Treatment Patient Details Name: [name] MRN: [id number] DOB: 03-17-1924 Today's Date: 12/09/2020    History of Present Illness 87 yo female admitted with lethargy, increased confusion. Found to have COVID. Hx of dementia, DM    PT Comments    Pt pleasantly confused, agreeable to therapy. Pt able to follow commands with BLE strengthening exercises, keeps count at quick pace requiring assist from therapist to count correctly. Pt appears to have increased difficulty with following single commands to come to sitting EOB, requiring increased time and reminders to complete. Mod A to upright trunk, slide BLE over to EOB and scoot out to place feet on floor with increased time and max A to lift BLE back into bed and reposition to center of bed in supine. Pt attempted STS reps from EOB with RW, maintains strong grip on RW and leans trunk posterior while sliding hips anterior despite cues to shift shoulders and trunk anterior to power up; therapist blocking pt's bil feet from sliding anterior. Unable to cue pt for correct transfer posture despite 3 attempts. Therapist reoriented pt to hospital and session during session and pt reports "ok that's fine". Pt will benefit from continued physical therapy in hospital and recommendations below to increase strength, balance, endurance for safe ADLs and gait.    Follow Up Recommendations  SNF;Supervision/Assistance - 24 hour (unless family chooses to take pt back home, then HHPT)     Equipment Recommendations  None recommended by PT    Recommendations for Other Services       Precautions / Restrictions Precautions Precautions: Fall Restrictions Weight Bearing Restrictions: No    Mobility  Bed Mobility Overal bed mobility: Needs Assistance Bed Mobility: Supine to Sit;Sit to Supine  Supine to sit: Mod assist;HOB elevated Sit to supine: Max assist   General bed mobility comments: constant multimodal cues to bring BLE over  to EOB and sit trunk up, mod A to upright trunk and scoot out to EOB. Max A to lift BLE back into bed and scoot up into center of bed    Transfers Overall transfer level: Needs assistance Equipment used: Rolling walker (2 wheeled) Transfers: Sit to/from Stand  General transfer comment: attempted STS x3 tries from EOB with max A +1, pt holds onto RW despite cues to push from seated surface, therapist braces RW and blocks bil feet but pt continues to slide hips forward and lean trunk backward trying to lift RW despite cues and education, unsuccessful at clearing bottom from bed  Ambulation/Gait  General Gait Details: not attempted   Stairs             Wheelchair Mobility    Modified Rankin (Stroke Patients Only)       Balance Overall balance assessment: Needs assistance Sitting-balance support: Feet supported;Bilateral upper extremity supported Sitting balance-Leahy Scale: Poor Sitting balance - Comments: sitting EOB Postural control: Posterior lean                                  Cognition Arousal/Alertness: Awake/alert Behavior During Therapy: WFL for tasks assessed/performed Overall Cognitive Status: History of cognitive impairments - at baseline  General Comments: Pt asks therapist where we are frequently during session, therapist able to reorient pt easily. Pt states "ok" when cued with commands for mobility but requires increased time and reminders; pt appears to follow commands with exercises easier, but counts to 10 too quickly requiring assist from therapist  to keep count      Exercises General Exercises - Lower Extremity Ankle Circles/Pumps: Supine;AROM;Strengthening;Both;10 reps Short Arc Quad: Supine;AROM;Strengthening;Both;10 reps Heel Slides: Supine;AROM;Strengthening;Both;10 reps    General Comments        Pertinent Vitals/Pain Pain Assessment: Faces Faces Pain Scale: No hurt    Home Living                      Prior  Function            PT Goals (current goals can now be found in the care plan section) Acute Rehab PT Goals Patient Stated Goal: pt unable to state PT Goal Formulation: Patient unable to participate in goal setting Time For Goal Achievement: 12/20/20 Potential to Achieve Goals: Fair Progress towards PT goals: Progressing toward goals    Frequency    Min 2X/week      PT Plan Current plan remains appropriate    Co-evaluation              AM-PAC PT "6 Clicks" Mobility   Outcome Measure  Help needed turning from your back to your side while in a flat bed without using bedrails?: A Lot Help needed moving from lying on your back to sitting on the side of a flat bed without using bedrails?: A Lot Help needed moving to and from a bed to a chair (including a wheelchair)?: Total Help needed standing up from a chair using your arms (e.g., wheelchair or bedside chair)?: Total Help needed to walk in hospital room?: Total Help needed climbing 3-5 steps with a railing? : Total 6 Click Score: 8    End of Session   Activity Tolerance: Patient tolerated treatment well Patient left: in bed;with call bell/phone within reach;with bed alarm set Nurse Communication: Mobility status;Other (comment) (confusion, reorienting pt throughout) PT Visit Diagnosis: Muscle weakness (generalized) (M62.81);Difficulty in walking, not elsewhere classified (R26.2)     Time: 1660-6301 PT Time Calculation (min) (ACUTE ONLY): 20 min  Charges:  $Therapeutic Activity: 8-22 mins                      Tori Kemond Amorin PT, DPT 12/09/20, 11:56 AM

## 2020-12-09 NOTE — Plan of Care (Signed)
  Problem: Clinical Measurements: Goal: Respiratory complications will improve Outcome: Adequate for Discharge   Problem: Clinical Measurements: Goal: Cardiovascular complication will be avoided Outcome: Adequate for Discharge

## 2020-12-09 NOTE — Discharge Instructions (Signed)
Dysphagia Eating Plan, Pureed This eating plan is helpful for people with moderate to severe swallowing problems. Pureed foods are smooth and are prepared without lumps so that they can be swallowed safely. Work with your health care provider and your diet and nutrition specialist (dietitian) to make sure that you are following the eating plan safely and getting all the nutrients you need. What are tips for following this plan? General information  You may eat foods that are soft and have a pudding-like texture.  Do not eat foods that you have to chew. If you have to chew the food, then you cannot eat it.  Avoid foods that are hard, dry, sticky, chunky, lumpy, or stringy. Also avoid foods with nuts, seeds, raisins, skins, or pulp.  You may be instructed to thicken liquids. Follow your health care provider's instructions about how to do this and to what consistency.  The foods you may eat are usually eaten with a spoon.  You can use a spoon or fork to test the texture of a food: ? Using a spoon, you can do a spoon tilt test to check if food holds together on the spoon and is not too firm, too sticky, or too thick. Food should hold its shape on the spoon and slide off easily with almost no food left on the spoon. ? With food on a fork, the food should sit on a mound or pile on top of the fork and should not seep or drip through the fork prongs continuously. Cooking  If a food is not originally a smooth texture, you may be able to eat the food after: ? Pureeing it. This can be done with a blender. ? Moistening it. This can be done by adding juice, cooking liquid, gravy, or sauce to a dry food and then pureeing it. For example, you may have bread if you soak it in milk and puree it.  If a food is too thin, you may add a commercial thickener, corn starch, rice cereal, or potato flakes to thicken it.  Strain and throw away any excess liquid or liquid that separates from a solid pureed food before  eating.  Strain lumps, chunks, pulp, and seeds from pureed foods before eating.  Reheat foods slowly to prevent a tough crust from forming.   Meal planning  Eat a variety of foods to get all the nutrients you need.  Add dry milk or protein powder to food to increase calories and protein content.  Follow your meal plan as told by your dietitian. What foods can I eat? Grains Soft breads, pancakes, [place] toast, muffins, and bread stuffing pureed to a smooth, moist texture, without nuts or seeds. Cooked cereals that have a pudding-like consistency, such as hot wheat cereal or farina. Pureed oatmeal. Pureed, well-cooked pasta and rice. Fruits Pureed fruits such as melons and apples without seeds or pulp. Mashed bananas. Mashed avocado. Fruit juices without pulp or seeds. Vegetables Pureed vegetables. Smooth tomato paste or sauce. Mashed or pureed potatoes without skin. Meats and other proteins Pureed meat, poultry, and fish. Smooth pate or liverwurst. Smooth souffles. Pureed beans (such as lentils). Pureed eggs. Smooth nut and seed butters. Pureed tofu. Dairy Yogurt. Milk. Pureed cottage cheese. Nutritional dairy drinks or shakes. Cream cheese. Smooth pudding, ice cream, sherbet, and malts. Fats and oils Butter. Margarine. Vegetable oils. Smooth and strained gravy. Sour cream. Mayonnaise. Smooth sauces such as white sauce, cheese sauce, or hollandaise sauce. Sweets and desserts Moistened and pureed cookies  and cakes. Whipped topping. Gelatin. Pudding pops. Seasonings and other foods Finely ground spices. Jelly. Honey. Pureed casseroles. Strained soups. Pureed sandwiches. Beverages Anything prepared at the consistency recommended by your dietitian. The items listed above may not be a complete list of foods and beverages you can eat. Contact a dietitian for more information. What foods must I avoid? Grains Oatmeal. Dry cereals. Hard breads. Breads with seeds or nuts. Whole pasta, rice, or  other grains. Whole pancakes, waffles, biscuits, muffins, or rolls. Fruits Whole fresh, frozen, canned, or dried fruits that have not been pureed. Stringy fruits, such as pineapple or coconut. Watermelon with seeds. Dried fruit or fruit leather. Vegetables Whole vegetables. Stringy vegetables (such as celery). Tomatoes or tomato sauce with seeds. Fried vegetables. Meats and other proteins Whole or ground meat, fish, or poultry. Dried or cooked lentils or legumes that have been cooked but not mashed or pureed. Non-pureed eggs. Nuts and seeds. Crunchy peanut butter. Whole tofu or other meat alternatives. Dairy Cheese cubes or slices. Non-pureed cottage cheese. Yogurt with fruit chunks. Fats and oils All fats and sauces that have lumps or chunks. Sweets and desserts Solid desserts. Sticky, chewy sweets (such as licorice and caramel). Candy with nuts or coconut. Seasonings and other foods Coarse or seeded herbs and spices. Chunky preserves. Jams with seeds. Whole sandwiches. Non-pureed casseroles. Chunky soups. The items listed above may not be a complete list of foods and beverages you should avoid. Contact a dietitian for more information. Summary  Pureed foods can be helpful for people with moderate to severe swallowing problems.  On this dysphagia eating plan, you may eat foods that are soft and have a pudding-like texture. Do not eat foods that you have to chew. If you have to chew the food, then you cannot eat it.  You may be instructed to thicken liquids. Follow your health care provider's instructions about how to do this and to what consistency. This information is not intended to replace advice given to you by your health care provider. Make sure you discuss any questions you have with your health care provider. Document Revised: 06/26/2020 Document Reviewed: 06/26/2020 Elsevier Patient Education  [place] Can Do to Manage Your COVID-19 Symptoms at  Home If you have possible or confirmed COVID-19: 1. Stay home except to get medical care. 2. Monitor your symptoms carefully. If your symptoms get worse, call your healthcare provider immediately. 3. Get rest and stay hydrated. 4. If you have a medical appointment, call the healthcare provider ahead of time and tell them that you have or may have COVID-19. 5. For medical emergencies, call 911 and notify the dispatch personnel that you have or may have COVID-19. 6. Cover your cough and sneezes with a tissue or use the inside of your elbow. 7. Wash your hands often with soap and water for at least 20 seconds or clean your hands with an alcohol-based hand sanitizer that contains at least 60% alcohol. 8. As much as possible, stay in a specific room and away from other people in your home. Also, you should use a separate bathroom, if available. If you need to be around other people in or outside of the home, wear a mask. 9. Avoid sharing personal items with other people in your household, like dishes, towels, and bedding. 10. Clean all surfaces that are touched often, like counters, tabletops, and doorknobs. Use household cleaning sprays or wipes according to the label instructions. [email or web address] 05/05/2020 This information  is not intended to replace advice given to you by your health care provider. Make sure you discuss any questions you have with your health care provider. Document Revised: 08/21/2020 Document Reviewed: 08/21/2020 Elsevier Patient Education  2021 [place] vs. Isolation QUARANTINE keeps someone who was in close contact with someone who has COVID-19 away from others. Quarantine if you have been in close contact with someone who has COVID-19, unless you have been fully vaccinated. If you are fully vaccinated  You do NOT need to quarantine unless they have symptoms  Get tested 3-5 days after your exposure, even if you don't have symptoms  Wear  a mask indoors in public for 14 days following exposure or until your test result is negative If you are not fully vaccinated  Stay home for 14 days after your last contact with a person who has COVID-19  Watch for fever (100.4F), cough, shortness of breath, or other symptoms of COVID-19  If possible, stay away from people you live with, especially people who are at higher risk for getting very sick from COVID-19  Contact your local public health department for options in your area to possibly shorten your quarantine ISOLATION keeps someone who is sick or tested positive for COVID-19 without symptoms away from others, even in their own home. People who are in isolation should stay home and stay in a specific "sick room" or area and use a separate bathroom (if available). If you are sick and think or know you have COVID-19 Stay home until after  At least 10 days since symptoms first appeared and  At least 24 hours with no fever without the use of fever-reducing medications and  Symptoms have improved If you tested positive for COVID-19 but do not have symptoms  Stay home until after 10 days have passed since your positive viral test  If you develop symptoms after testing positive, follow the steps above for those who are sick [email or web address] 07/17/2020 This information is not intended to replace advice given to you by your health care provider. Make sure you discuss any questions you have with your health care provider. Document Revised: 08/21/2020 Document Reviewed: 08/21/2020 Elsevier Patient Education  2021 [employer].

## 2020-12-09 NOTE — TOC Progression Note (Signed)
Transition of Care [facility]) - Progression Note    Patient Details  Name: [name] MRN: [id number] Date of Birth: June 23, 1924  [facility] CM/SW Contact  Joaquin Courts, RN Phone Number: 12/09/2020, 12:43 PM  Clinical Narrative:    Corey Harold transport home arranged.   Expected Discharge Plan: Home/Self Care Barriers to Discharge: No Barriers Identified  Expected Discharge Plan and Services Expected Discharge Plan: Home/Self Care   Discharge Planning Services: CM Consult     Expected Discharge Date: 12/09/20                                     Social Determinants of Health (SDOH) Interventions    Readmission Risk Interventions No flowsheet data found.

## 2020-12-11 LAB — CULTURE, BLOOD (ROUTINE X 2)
Culture: NO GROWTH
Culture: NO GROWTH
Special Requests: ADEQUATE

## 2020-12-29 DIAGNOSIS — U099 Post covid-19 condition, unspecified: Secondary | ICD-10-CM | POA: Diagnosis not present

## 2020-12-29 DIAGNOSIS — E11620 Type 2 diabetes mellitus with diabetic dermatitis: Secondary | ICD-10-CM | POA: Diagnosis not present

## 2020-12-29 DIAGNOSIS — I700 Atherosclerosis of aorta: Secondary | ICD-10-CM | POA: Diagnosis not present

## 2020-12-29 DIAGNOSIS — R053 Chronic cough: Secondary | ICD-10-CM | POA: Diagnosis not present

## 2020-12-29 DIAGNOSIS — K5901 Slow transit constipation: Secondary | ICD-10-CM | POA: Diagnosis not present

## 2020-12-29 DIAGNOSIS — L89309 Pressure ulcer of unspecified buttock, unspecified stage: Secondary | ICD-10-CM | POA: Diagnosis not present

## 2021-05-09 DIAGNOSIS — E11620 Type 2 diabetes mellitus with diabetic dermatitis: Secondary | ICD-10-CM | POA: Diagnosis not present

## 2021-05-09 DIAGNOSIS — U099 Post covid-19 condition, unspecified: Secondary | ICD-10-CM | POA: Diagnosis not present

## 2021-05-09 DIAGNOSIS — K5901 Slow transit constipation: Secondary | ICD-10-CM | POA: Diagnosis not present

## 2021-05-09 DIAGNOSIS — L89309 Pressure ulcer of unspecified buttock, unspecified stage: Secondary | ICD-10-CM | POA: Diagnosis not present

## 2021-05-09 DIAGNOSIS — L602 Onychogryphosis: Secondary | ICD-10-CM | POA: Diagnosis not present

## 2021-05-10 ENCOUNTER — Telehealth: Payer: Self-pay

## 2021-05-10 NOTE — Telephone Encounter (Signed)
Spoke with patient's DIL Ivin Booty and son Marcello Moores and scheduled an in-person Palliative Consult for 06/13/21 @ 9AM.   COVID screening was negative. One dog in the home will put away before NP arrives. Patient lives with son and DIL.  Consent obtained; updated Outlook/Netsmart/Team List and Epic.   Family is aware they may be receiving a call from NP the day before or day of to confirm appointment.

## 2021-06-13 ENCOUNTER — Other Ambulatory Visit: Payer: Self-pay

## 2021-06-13 ENCOUNTER — Other Ambulatory Visit: Payer: Medicare Other | Admitting: Hospice

## 2021-06-13 DIAGNOSIS — E1165 Type 2 diabetes mellitus with hyperglycemia: Secondary | ICD-10-CM | POA: Diagnosis not present

## 2021-06-13 DIAGNOSIS — Z515 Encounter for palliative care: Secondary | ICD-10-CM | POA: Diagnosis not present

## 2021-06-13 DIAGNOSIS — M159 Polyosteoarthritis, unspecified: Secondary | ICD-10-CM

## 2021-06-13 DIAGNOSIS — F0391 Unspecified dementia with behavioral disturbance: Secondary | ICD-10-CM

## 2021-06-13 NOTE — Progress Notes (Signed)
[facility] Consult Note Telephone: [phone]  Fax: [phone]  PATIENT NAME: [name] [address] [address] [phone] (home)  DOB: 03-17-1924 MRN: [id number]  PRIMARY CARE PROVIDER:    Merrilee Seashore, MD,  [address] [place] [address] [phone]  REFERRING PROVIDER:   Merrilee Seashore, [address] [place],   [address] [phone]  RESPONSIBLE PARTY:   Son  Contact Information     Name Relation Home Work Mobile   [place]ar   [phone]        I met face to face with patient and family at home. Palliative Care was asked to follow this patient by consultation request of  Merrilee Seashore, MD to address advance care planning, complex medical decision making and goals of care clarification. Patient was home with daughter in [place], grand daughter; Thomas later joined the visit. This is the initial visit.    ASSESSMENT AND / RECOMMENDATIONS:   Advance Care Planning: Our advance care planning conversation included a discussion about:    The value and importance of advance care planning  Difference between Hospice and Palliative care Exploration of goals of care in the event of a sudden injury or illness  Identification and preparation of a healthcare agent  Review and updating or creation of an  advance directive document .  CODE STATUS: Discussion with Marcello Moores and Ivin Booty on ramifications and implications of code status. Marcello Moores said he will further discuss with family; patient remains a Full code currently.   Goals of Care: Goals include to maximize quality of life and symptom management  I spent  20 minutes providing this initial consultation. More than 50% of the time in this consultation was spent on counseling patient and coordinating  communication. --------------------------------------------------------------------------------------------------------------------------------------  Symptom Management/Plan: Dementia: Advanced/worsening.  Total care.  Continue Namzaric and ongoing supportive care. FAST 7b FLACC 0. Home health service ordered by PCP.  Family is open to this; Ivin Booty reports wanting family to lead in care.  Validation provided; education provided that home health RN will be beneficial for nursing care of patient and her pressure wound on buttock. HH will also provide assistance in ADLs. Osteoarthritis: Tylenol 500 mg 3 times daily as needed pain. Type 2 diabetes mellitus: Managed with glipizide.  Last A1c 12/05/2020 is 6.5.  Repeat A1c.  Routine CBC BMP. Follow up: Palliative care will continue to follow for complex medical decision making, advance care planning, and clarification of goals. Return 6 weeks or prn.Encouraged to call provider sooner with any concerns.   Family /Caregiver/Community Supports: Patient lives at home with her son and daughter-in-law.  Family appears to need direction.  Granddaughter comes in to help.  HOSPICE ELIGIBILITY/DIAGNOSIS: TBD  Chief Complaint: Initial Palliative care visit  HISTORY OF PRESENT ILLNESS:  Ericca Labra is a 87 y.o. year old female  with multiple medical conditions including Advanced Dementia, with worsening memory loss, weight loss and total dependence for care. Dementia has impaired her quality of life, now bedbound, not able to engage in meaningful conversation, worse as the day progresses.  Cueing is sometimes helpful but not always.  History of type 2 diabetes mellitus, osteoarthritis, COVID virus infection.  Pressure injury on buttock - unspecified laterality and stage History obtained from review of EMR, discussion with primary team, caregiver, family and/or Ms. Boven.  Review and summarization of Epic records shows history from other than patient. Rest of  10 point  ROS asked and negative.     Review of lab tests/diagnostics   Results for AMENA, DOCKHAM (MRN [id number]) as of 06/13/2021 10:53  Ref. Range 12/09/2020 01:22  COMPREHENSIVE METABOLIC PANEL Unknown Rpt (A)  Sodium Latest Ref Range: 135 - 145 mmol/L 137  Potassium Latest Ref Range: 3.5 - 5.1 mmol/L 3.9  Chloride Latest Ref Range: 98 - 111 mmol/L 103  CO2 Latest Ref Range: 22 - 32 mmol/L 23  Glucose Latest Ref Range: 70 - 99 mg/dL 94  BUN Latest Ref Range: 8 - 23 mg/dL 12  Creatinine Latest Ref Range: 0.44 - 1.00 mg/dL 0.62  Calcium Latest Ref Range: 8.9 - 10.3 mg/dL 8.0 (L)  Anion gap Latest Ref Range: 5 - 15  11  Magnesium Latest Ref Range: 1.7 - 2.4 mg/dL 2.1  Alkaline Phosphatase Latest Ref Range: 38 - 126 U/L 59  Albumin Latest Ref Range: 3.5 - 5.0 g/dL 2.6 (L)  AST Latest Ref Range: 15 - 41 U/L 15  ALT Latest Ref Range: 0 - 44 U/L 10  Total Protein Latest Ref Range: 6.5 - 8.1 g/dL 5.9 (L)  Total Bilirubin Latest Ref Range: 0.3 - 1.2 mg/dL 0.8  GFR, Estimated Latest Ref Range: >60 mL/min >60  LDH Latest Ref Range: 98 - 192 U/L 193 (H)  CRP Latest Ref Range: <1.0 mg/dL 6.1 (H)  WBC Latest Ref Range: 4.0 - 10.5 K/uL 4.0  RBC Latest Ref Range: 3.87 - 5.11 MIL/uL 3.73 (L)  Hemoglobin Latest Ref Range: 12.0 - 15.0 g/dL 9.7 (L)  HCT Latest Ref Range: 36.0 - 46.0 % 32.0 (L)  MCV Latest Ref Range: 80.0 - 100.0 fL 85.8  MCH Latest Ref Range: 26.0 - 34.0 pg 26.0  MCHC Latest Ref Range: 30.0 - 36.0 g/dL 30.3  RDW Latest Ref Range: 11.5 - 15.5 % 14.8  Platelets Latest Ref Range: 150 - 400 K/uL 281  Results for WENDEE, HATA (MRN [id number]) as of 06/13/2021 10:53  Ref. Range 12/05/2020 23:18  Hemoglobin A1C Latest Ref Range: 4.8 - 5.6 % 6.5 (H)    Physical Exam Constitutional: NAD General: Well groomed, cooperative, FLACC 0 EYES: anicteric sclera, lids intact, no discharge  ENMT: Moist mucous membrane CV: S1 S2, RRR, no LE edema Pulmonary: LCTA, no increased work  of breathing, no cough, Abdomen: active BS + 4 quadrants, soft and non tender GU: no suprapubic tenderness MSK: weakness, sarcopenia, limited ROM,  Skin: warm and dry, no rashes or wounds on visible skin Neuro:  weakness, otherwise non focal.  Memory loss/confusion Psych: non-anxious affect Hem/lymph/immuno: no widespread bruising   PAST MEDICAL HISTORY:  Active Ambulatory Problems    Diagnosis Date Noted   Hypertension 09/12/2017   Diabetes mellitus without complication ([facility]) 82/64/1583   Dementia without behavioral disturbance ([facility]) 09/12/2017   Hyponatremia 09/12/2017   Incidentaloma of thyroid gland 09/12/2017   TIA (transient ischemic attack) 09/12/2017   Cough    Allergic rhinitis    GERD without esophagitis    COVID-19 virus infection 12/05/2020   Constipation 12/05/2020   Pressure ulcer, buttock 12/05/2020   Type 2 diabetes mellitus with hyperglycemia, without long-term current use of insulin ([facility]) 12/05/2020   COVID-19 12/06/2020   Resolved Ambulatory Problems    Diagnosis Date Noted   Acute metabolic encephalopathy 09/40/7680   Hypoglycemia 12/06/2020   Past Medical History:  Diagnosis Date   Arthritis    Dementia ([facility])    GERD (gastroesophageal reflux disease)    Glaucoma  SOCIAL HX:  Social History   Tobacco Use   Smoking status: Never   Smokeless tobacco: Never  Substance Use Topics   Alcohol use: No     FAMILY HX:  Family History  Problem Relation Age of Onset   Heart attack Neg Hx    Stroke Neg Hx       ALLERGIES:  Allergies  Allergen Reactions   Lisinopril     Other reaction(s): cough   Prochlorperazine     Other reaction(s): Unknown   Sulfamethoxazole-Trimethoprim     Other reaction(s): rash      PERTINENT MEDICATIONS:  Outpatient Encounter Medications as of 06/13/2021  Medication Sig   acetaminophen (TYLENOL) 650 MG CR tablet Take 650 mg by mouth daily as needed for pain.   divalproex (DEPAKOTE) 250 MG DR tablet Take 250 mg  by mouth 2 (two) times daily.   dorzolamide-timolol (COSOPT) 22.3-6.8 MG/ML ophthalmic solution Place 1 drop into both eyes 3 (three) times daily.   glipiZIDE (GLUCOTROL) 5 MG tablet Take 5 mg by mouth daily before breakfast.   Memantine HCl-Donepezil HCl 28-10 MG CP24 Take 1 capsule by mouth daily.   travoprost, benzalkonium, (TRAVATAN) 0.004 % ophthalmic solution Place 1 drop into both eyes at bedtime.   No facility-administered encounter medications on file as of 06/13/2021.   Thank you for the opportunity to participate in the care of Ms. Alban.  The palliative care team will continue to follow. Please call our office at [phone] if we can be of additional assistance.   Note: Portions of this note were generated with [employer]. Dictation errors may occur despite best attempts at proofreading.  Teodoro Spray, NP

## 2021-06-20 DIAGNOSIS — E1165 Type 2 diabetes mellitus with hyperglycemia: Secondary | ICD-10-CM | POA: Diagnosis not present

## 2021-06-20 DIAGNOSIS — I10 Essential (primary) hypertension: Secondary | ICD-10-CM | POA: Diagnosis not present

## 2021-06-20 DIAGNOSIS — I700 Atherosclerosis of aorta: Secondary | ICD-10-CM | POA: Diagnosis not present

## 2021-07-19 ENCOUNTER — Other Ambulatory Visit: Payer: Medicare Other | Admitting: Hospice

## 2021-07-19 ENCOUNTER — Other Ambulatory Visit: Payer: Self-pay

## 2021-10-19 DIAGNOSIS — E1165 Type 2 diabetes mellitus with hyperglycemia: Secondary | ICD-10-CM | POA: Diagnosis not present

## 2021-10-19 DIAGNOSIS — I700 Atherosclerosis of aorta: Secondary | ICD-10-CM | POA: Diagnosis not present

## 2021-10-19 DIAGNOSIS — I10 Essential (primary) hypertension: Secondary | ICD-10-CM | POA: Diagnosis not present
